# Patient Record
Sex: Female | Born: 1945 | Race: Black or African American | Hispanic: No | Marital: Single | Smoking: Current every day smoker
Health system: Southern US, Community
[De-identification: ages and names within clinical notes are randomized; demographics above are authoritative.]

## PROBLEM LIST (undated history)

## (undated) DIAGNOSIS — M1990 Unspecified osteoarthritis, unspecified site: Secondary | ICD-10-CM

## (undated) HISTORY — PX: APPENDECTOMY: SHX54

## (undated) HISTORY — DX: Unspecified osteoarthritis, unspecified site: M19.90

## (undated) HISTORY — PX: ABDOMINAL HYSTERECTOMY: SHX81

---

## 1997-08-24 ENCOUNTER — Emergency Department (HOSPITAL_COMMUNITY): Admission: EM | Admit: 1997-08-24 | Discharge: 1997-08-24 | Payer: Self-pay

## 1997-12-03 ENCOUNTER — Encounter: Payer: Self-pay | Admitting: Emergency Medicine

## 1997-12-03 ENCOUNTER — Emergency Department (HOSPITAL_COMMUNITY): Admission: EM | Admit: 1997-12-03 | Discharge: 1997-12-03 | Payer: Self-pay | Admitting: Emergency Medicine

## 1998-02-24 ENCOUNTER — Emergency Department (HOSPITAL_COMMUNITY): Admission: EM | Admit: 1998-02-24 | Discharge: 1998-02-24 | Payer: Self-pay | Admitting: Emergency Medicine

## 1999-02-10 ENCOUNTER — Emergency Department (HOSPITAL_COMMUNITY): Admission: EM | Admit: 1999-02-10 | Discharge: 1999-02-11 | Payer: Self-pay | Admitting: Emergency Medicine

## 1999-07-02 ENCOUNTER — Encounter: Admission: RE | Admit: 1999-07-02 | Discharge: 1999-07-02 | Payer: Self-pay | Admitting: *Deleted

## 1999-07-28 ENCOUNTER — Emergency Department (HOSPITAL_COMMUNITY): Admission: EM | Admit: 1999-07-28 | Discharge: 1999-07-28 | Payer: Self-pay

## 2000-06-07 ENCOUNTER — Emergency Department (HOSPITAL_COMMUNITY): Admission: EM | Admit: 2000-06-07 | Discharge: 2000-06-08 | Payer: Self-pay | Admitting: Emergency Medicine

## 2001-01-03 ENCOUNTER — Emergency Department (HOSPITAL_COMMUNITY): Admission: EM | Admit: 2001-01-03 | Discharge: 2001-01-03 | Payer: Self-pay | Admitting: Emergency Medicine

## 2001-01-03 ENCOUNTER — Encounter: Payer: Self-pay | Admitting: Emergency Medicine

## 2001-11-22 ENCOUNTER — Encounter: Admission: RE | Admit: 2001-11-22 | Discharge: 2001-11-22 | Payer: Self-pay | Admitting: Obstetrics and Gynecology

## 2001-11-22 ENCOUNTER — Encounter: Payer: Self-pay | Admitting: Obstetrics and Gynecology

## 2002-03-26 ENCOUNTER — Encounter: Payer: Self-pay | Admitting: Orthopedic Surgery

## 2002-03-26 ENCOUNTER — Encounter: Admission: RE | Admit: 2002-03-26 | Discharge: 2002-03-26 | Payer: Self-pay | Admitting: Orthopedic Surgery

## 2004-01-08 ENCOUNTER — Encounter: Admission: RE | Admit: 2004-01-08 | Discharge: 2004-01-08 | Payer: Self-pay | Admitting: Family Medicine

## 2005-09-30 ENCOUNTER — Ambulatory Visit: Payer: Self-pay | Admitting: Internal Medicine

## 2005-10-01 ENCOUNTER — Ambulatory Visit: Payer: Self-pay | Admitting: *Deleted

## 2005-11-25 ENCOUNTER — Ambulatory Visit: Payer: Self-pay | Admitting: Internal Medicine

## 2005-11-30 ENCOUNTER — Encounter: Admission: RE | Admit: 2005-11-30 | Discharge: 2005-11-30 | Payer: Self-pay | Admitting: Internal Medicine

## 2005-12-08 ENCOUNTER — Ambulatory Visit: Payer: Self-pay | Admitting: Family Medicine

## 2005-12-09 ENCOUNTER — Ambulatory Visit (HOSPITAL_COMMUNITY): Admission: RE | Admit: 2005-12-09 | Discharge: 2005-12-09 | Payer: Self-pay | Admitting: Internal Medicine

## 2005-12-16 ENCOUNTER — Ambulatory Visit: Payer: Self-pay | Admitting: Internal Medicine

## 2006-03-22 ENCOUNTER — Ambulatory Visit: Payer: Self-pay | Admitting: Internal Medicine

## 2006-11-02 ENCOUNTER — Encounter (INDEPENDENT_AMBULATORY_CARE_PROVIDER_SITE_OTHER): Payer: Self-pay | Admitting: *Deleted

## 2007-01-11 ENCOUNTER — Ambulatory Visit: Payer: Self-pay | Admitting: Internal Medicine

## 2007-04-13 ENCOUNTER — Ambulatory Visit: Payer: Self-pay | Admitting: Family Medicine

## 2007-06-30 ENCOUNTER — Encounter (INDEPENDENT_AMBULATORY_CARE_PROVIDER_SITE_OTHER): Payer: Self-pay | Admitting: Surgery

## 2007-06-30 ENCOUNTER — Inpatient Hospital Stay (HOSPITAL_COMMUNITY): Admission: EM | Admit: 2007-06-30 | Discharge: 2007-07-13 | Payer: Self-pay | Admitting: Emergency Medicine

## 2007-07-20 ENCOUNTER — Encounter: Admission: RE | Admit: 2007-07-20 | Discharge: 2007-07-20 | Payer: Self-pay | Admitting: Surgery

## 2008-01-26 ENCOUNTER — Ambulatory Visit: Payer: Self-pay | Admitting: Internal Medicine

## 2008-02-02 ENCOUNTER — Ambulatory Visit: Payer: Self-pay | Admitting: Family Medicine

## 2008-02-22 ENCOUNTER — Ambulatory Visit: Payer: Self-pay | Admitting: Internal Medicine

## 2008-02-23 ENCOUNTER — Encounter: Payer: Self-pay | Admitting: Family Medicine

## 2008-02-23 LAB — CONVERTED CEMR LAB
ALT: 22 units/L (ref 0–35)
AST: 18 units/L (ref 0–37)
Albumin: 4.3 g/dL (ref 3.5–5.2)
Alkaline Phosphatase: 107 units/L (ref 39–117)
BUN: 6 mg/dL (ref 6–23)
Basophils Absolute: 0 10*3/uL (ref 0.0–0.1)
Basophils Relative: 0 % (ref 0–1)
CO2: 18 meq/L — ABNORMAL LOW (ref 19–32)
Calcium: 9 mg/dL (ref 8.4–10.5)
Chloride: 108 meq/L (ref 96–112)
Cholesterol: 106 mg/dL (ref 0–200)
Creatinine, Ser: 0.69 mg/dL (ref 0.40–1.20)
Eosinophils Absolute: 0.2 10*3/uL (ref 0.0–0.7)
Eosinophils Relative: 1 % (ref 0–5)
Glucose, Bld: 104 mg/dL — ABNORMAL HIGH (ref 70–99)
HCT: 42.7 % (ref 36.0–46.0)
HDL: 56 mg/dL (ref >39)
Hemoglobin: 13.7 g/dL (ref 12.0–15.0)
LDL Cholesterol: 35 mg/dL (ref 0–99)
Lymphocytes Relative: 42 % (ref 12–46)
Lymphs Abs: 4.5 10*3/uL — ABNORMAL HIGH (ref 0.7–4.0)
MCHC: 32.1 g/dL (ref 30.0–36.0)
MCV: 90.3 fL (ref 78.0–100.0)
Monocytes Absolute: 0.7 10*3/uL (ref 0.1–1.0)
Monocytes Relative: 6 % (ref 3–12)
Neutro Abs: 5.5 10*3/uL (ref 1.7–7.7)
Neutrophils Relative %: 51 % (ref 43–77)
Platelets: 236 10*3/uL (ref 150–400)
Potassium: 3.5 meq/L (ref 3.5–5.3)
RBC: 4.73 M/uL (ref 3.87–5.11)
RDW: 15.9 % — ABNORMAL HIGH (ref 11.5–15.5)
Sodium: 145 meq/L (ref 135–145)
TSH: 1.391 microintl units/mL (ref 0.350–4.50)
Total Bilirubin: 0.4 mg/dL (ref 0.3–1.2)
Total CHOL/HDL Ratio: 1.9
Total Protein: 7.5 g/dL (ref 6.0–8.3)
Triglycerides: 73 mg/dL (ref <150)
VLDL: 15 mg/dL (ref 0–40)
WBC: 10.8 10*3/uL — ABNORMAL HIGH (ref 4.0–10.5)

## 2008-03-15 ENCOUNTER — Ambulatory Visit: Payer: Self-pay | Admitting: Internal Medicine

## 2008-06-28 ENCOUNTER — Ambulatory Visit: Payer: Self-pay | Admitting: *Deleted

## 2008-07-02 ENCOUNTER — Ambulatory Visit: Payer: Self-pay | Admitting: *Deleted

## 2008-09-09 ENCOUNTER — Ambulatory Visit: Payer: Self-pay | Admitting: Internal Medicine

## 2008-11-25 ENCOUNTER — Ambulatory Visit: Payer: Self-pay | Admitting: Family Medicine

## 2008-12-25 ENCOUNTER — Ambulatory Visit: Payer: Self-pay | Admitting: Family Medicine

## 2010-01-10 IMAGING — CT CT ABDOMEN W/ CM
2 of 5 series · 17 of 46 positions shown, 19 images · IV contrast ([ID]/WATER & 100 ML OMNI 300)
Comparison: 06/27/2007.

CT ABDOMEN

CLINICAL DATA: Leukocytosis.  Status post laparoscopic
appendectomy for ruptured appendix.  Clinical concern for abscess.
Previous partial hysterectomy.

CT ABDOMEN AND PELVIS WITH CONTRAST
TECHNIQUE: Multidetector CT imaging of the abdomen and pelvis was
performed using the standard protocol following bolus
administration of intravenous contrast.
Contrast: 100 ml Pmnipaque-SYY

[Series 2: routine abdomen · axial · 0.70mm/px · z∈[-490,-60]mm · 14 of 96 slices shown, 16 images]
[im 5/96  soft-tissue]
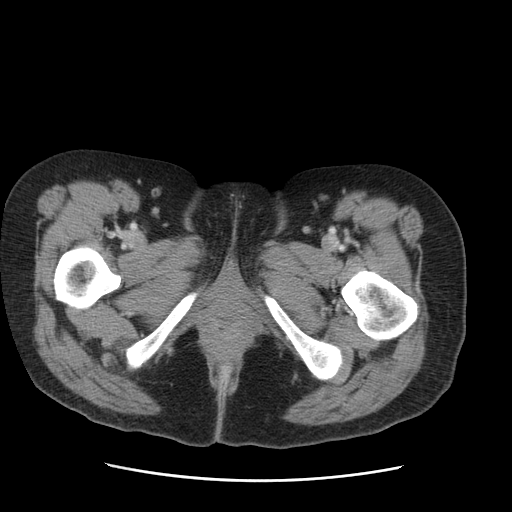
[im 5/96  bone]
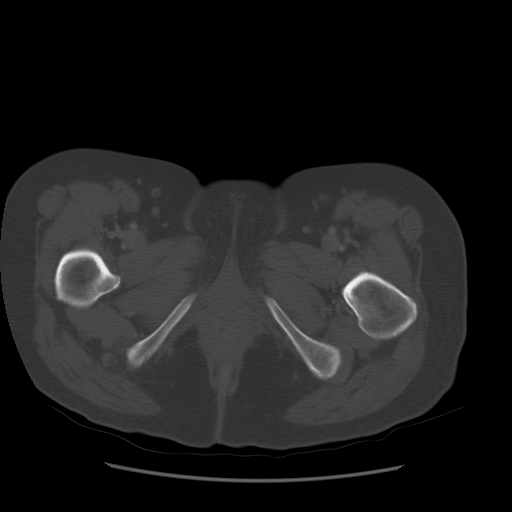
[im 14/96  soft-tissue]
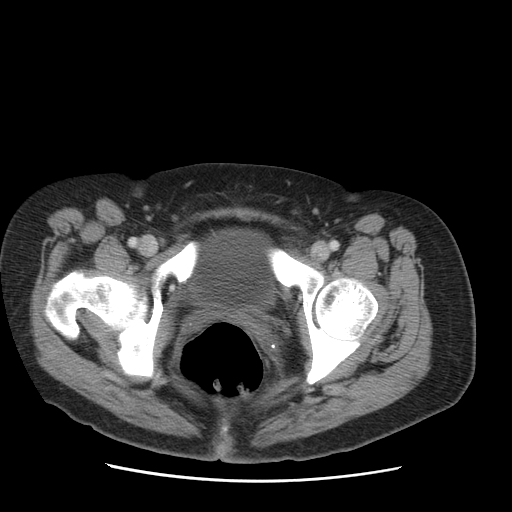
[im 19/96  soft-tissue]
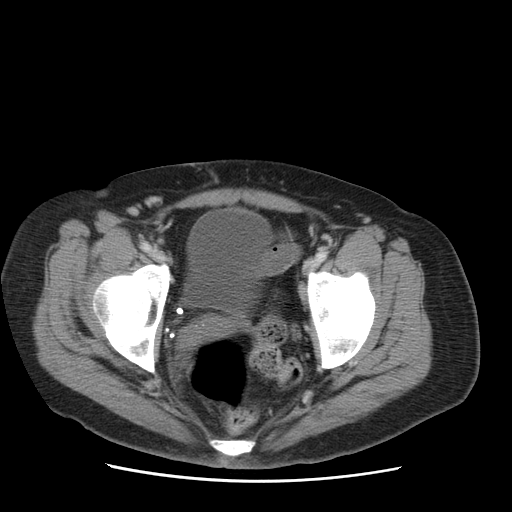
[im 28/96  soft-tissue]
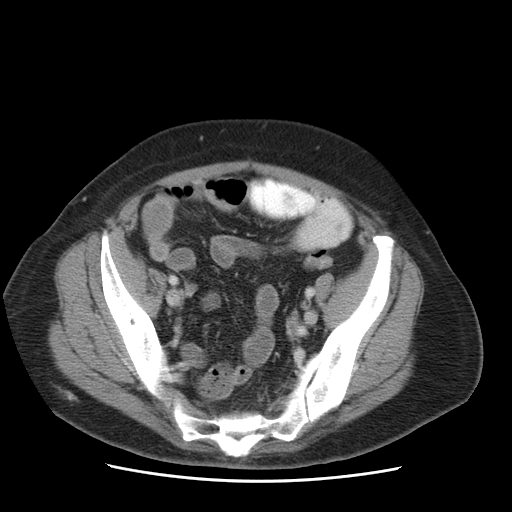
[im 32/96  soft-tissue]
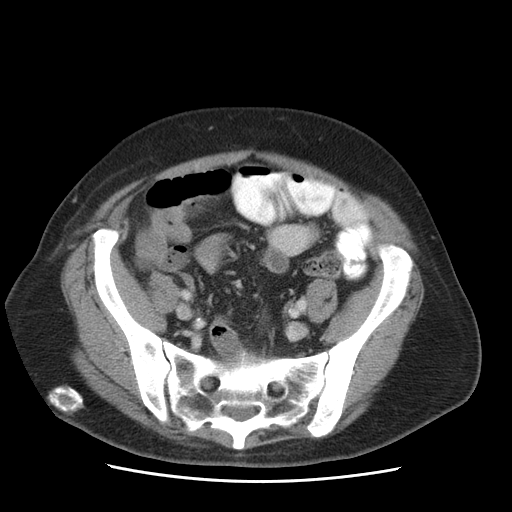
[im 37/96  soft-tissue]
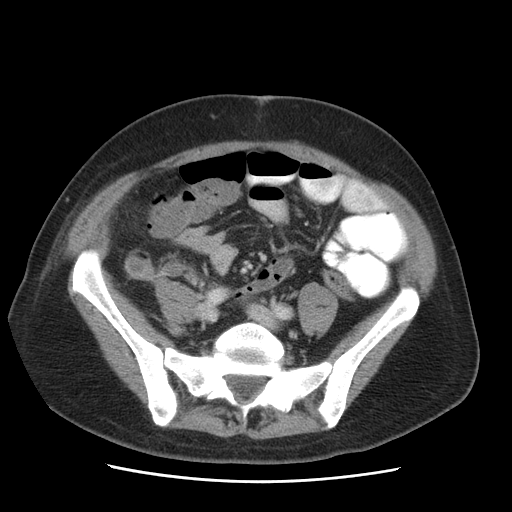
[im 46/96  soft-tissue]
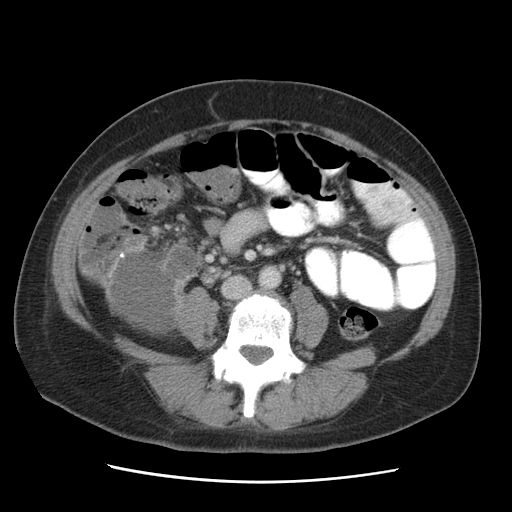
[im 50/96  soft-tissue]
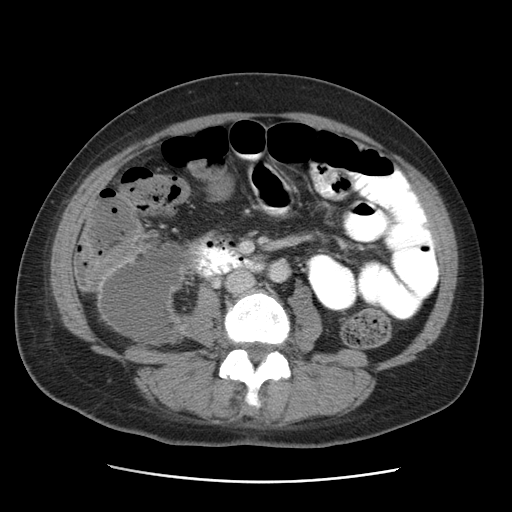
[im 59/96  soft-tissue]
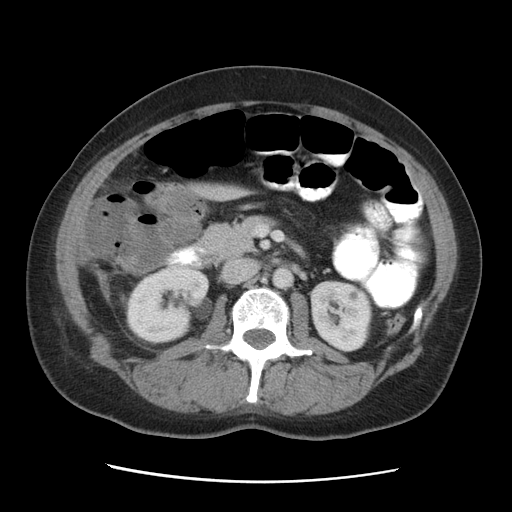
[im 59/96  bone]
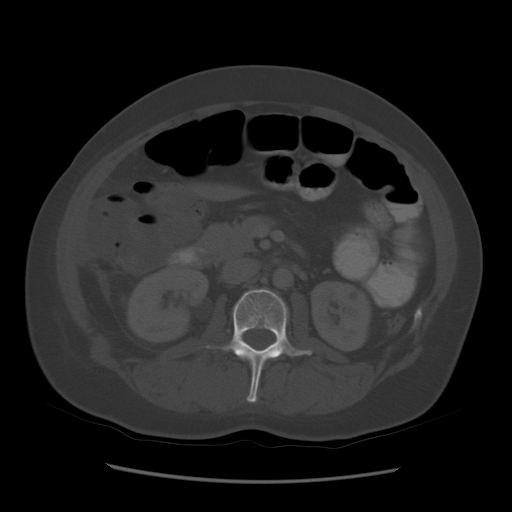
[im 64/96  soft-tissue]
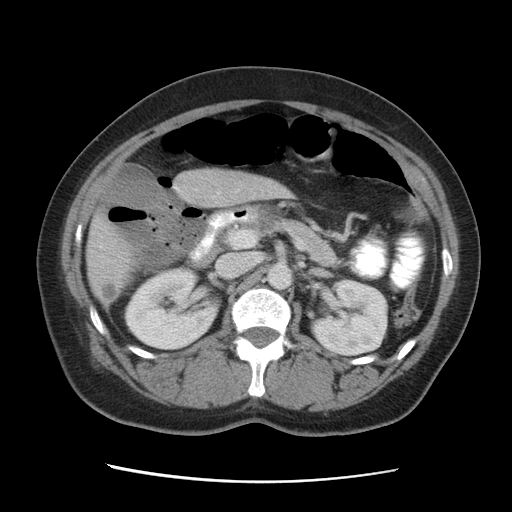
[im 73/96  soft-tissue]
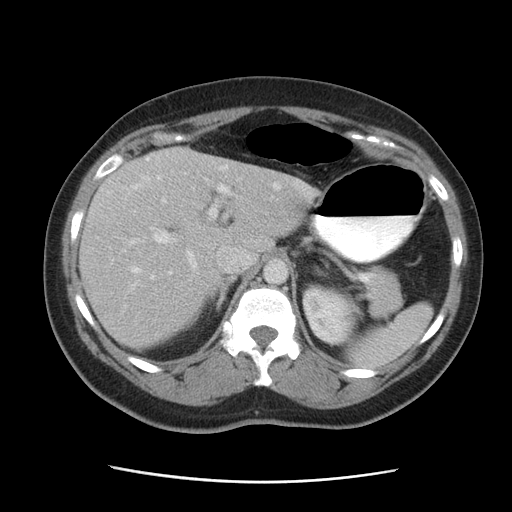
[im 77/96  soft-tissue]
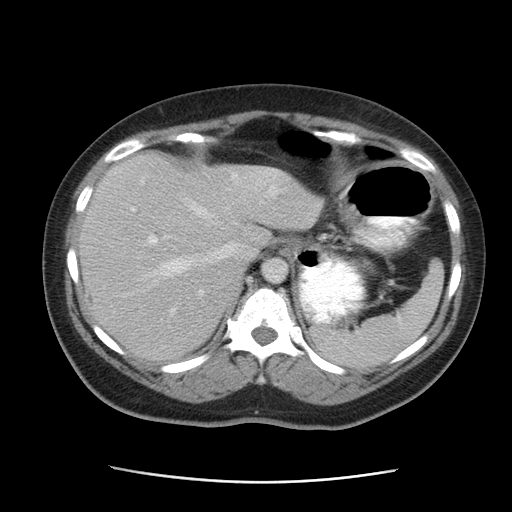
[im 82/96  soft-tissue]
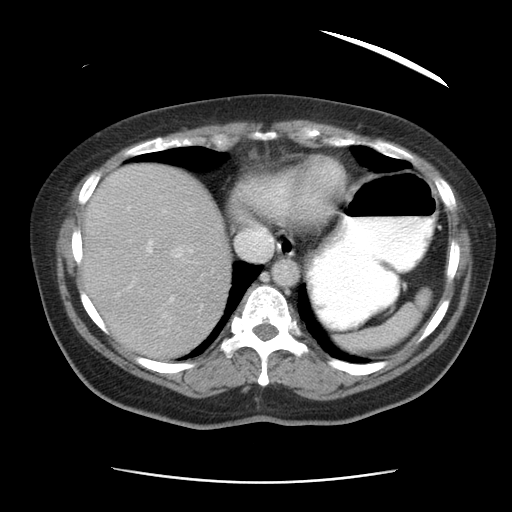
[im 91/96  soft-tissue]
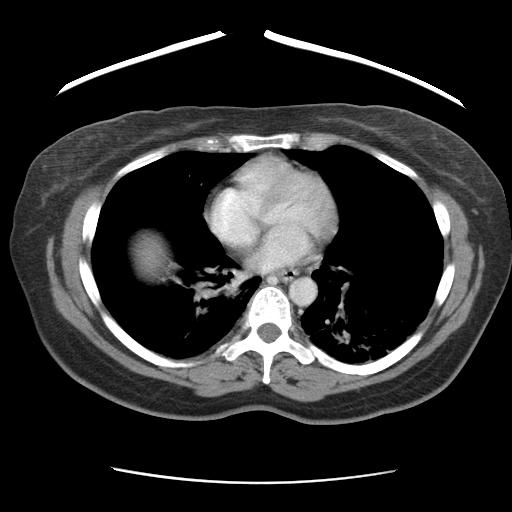

[Series 400: cor abd/pel · coronal · 0.94mm/px · 3 of 58 slices shown]
[im 20/58  soft-tissue]
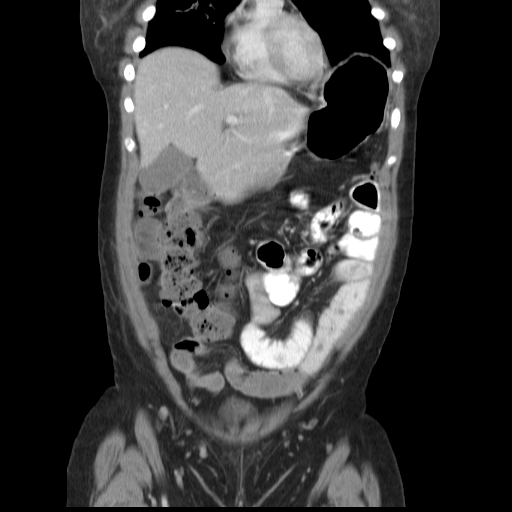
[im 26/58  soft-tissue]
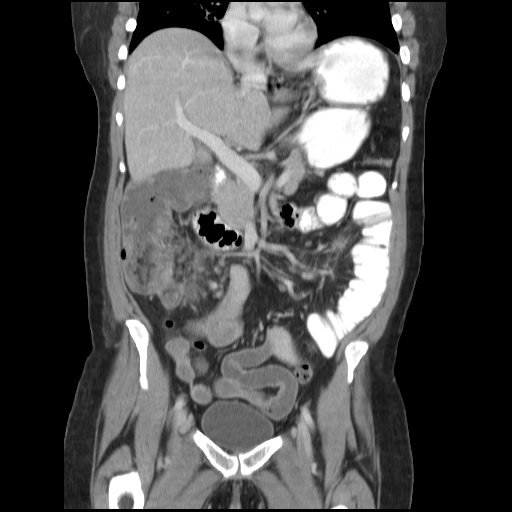
[im 32/58  soft-tissue]
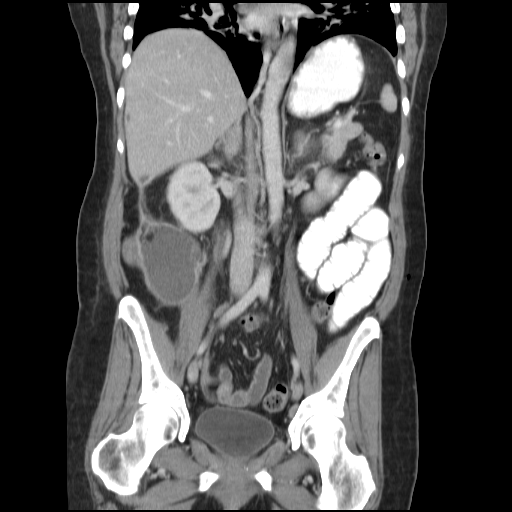

[17 of 46 positions shown; findings below may reference images not displayed]

FINDINGS: Bibasilar atelectasis.  Stable mild diffuse fatty
infiltration.  Interval small amount of subcapsular hepatic fluid.
Interval rim enhancing fluid collection in the right lower abdomen,
measuring 7.4 x 6.5 x 5.3 cm in maximum dimensions.  Mildly dilated
proximal small bowel loops.  No free peritoneal air.
IMPRESSION: 1.  7.4 x 6.5 x 5.3 cm right lower quadrant abscess.
2.  Bibasilar atelectasis.
3.  Interval small amount of subcapsular hepatic fluid.
4.  Mild small bowel ileus.

CT PELVIS
FINDINGS: Normal appearing pelvic bowel loops.  Surgically absent
uterus.  No free peritoneal fluid.  Right buttock injection
granuloma.
IMPRESSION: No acute pelvic abnormality.

## 2010-01-11 IMAGING — CT CT ABCESS DRAINAGE
1 series · 16 of 32 positions shown, 19 images · non-contrast
Comparison: none

EXAMINATION:

CT GUIDED RIGHT LOWER QUADRANT APPENDICEAL ABSCESS DRAINAGE
PROCEDURE.
CLINICAL DATA: Ruptured appendicitis with right lower quadrant
abscess

[Series 2: abd pelvis · axial · 0.74mm/px · z∈[-278,+154]mm · 16 of 145 slices shown, 19 images]
[im 10/145  soft-tissue]
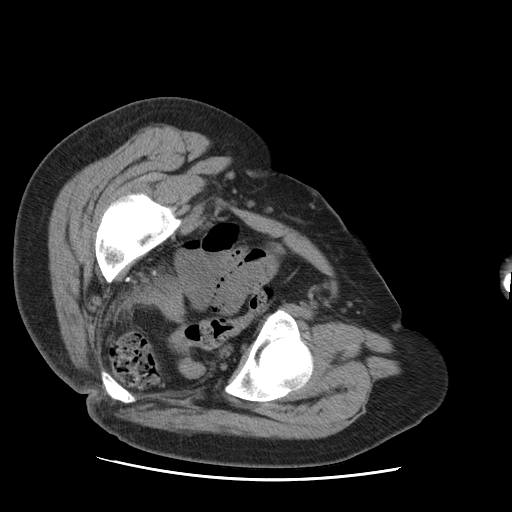
[im 10/145  bone]
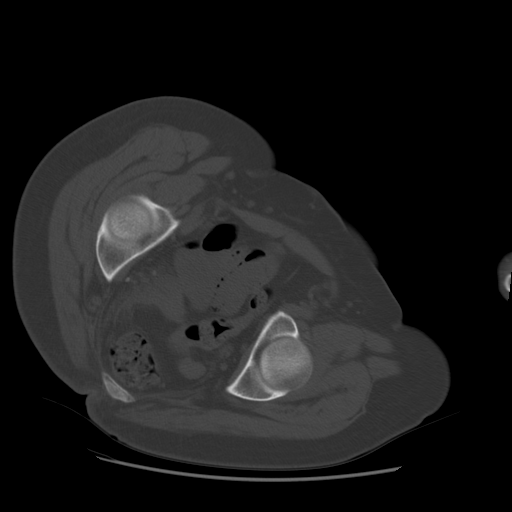
[im 19/145  soft-tissue]
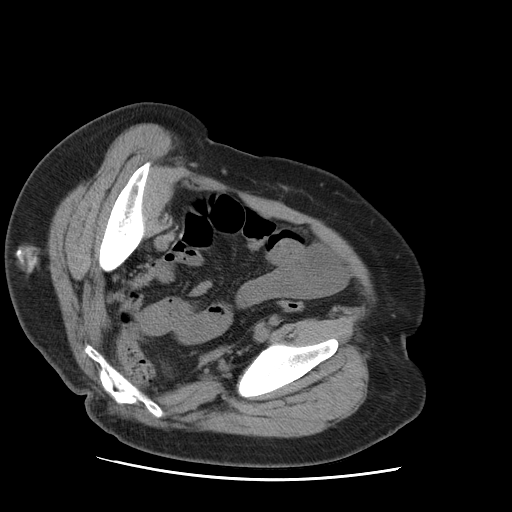
[im 28/145  soft-tissue]
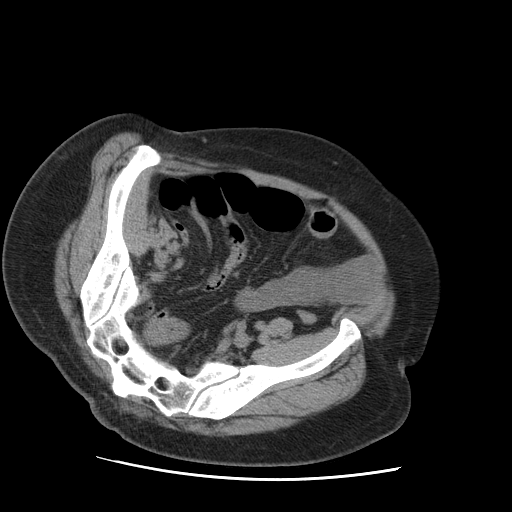
[im 42/145  soft-tissue]
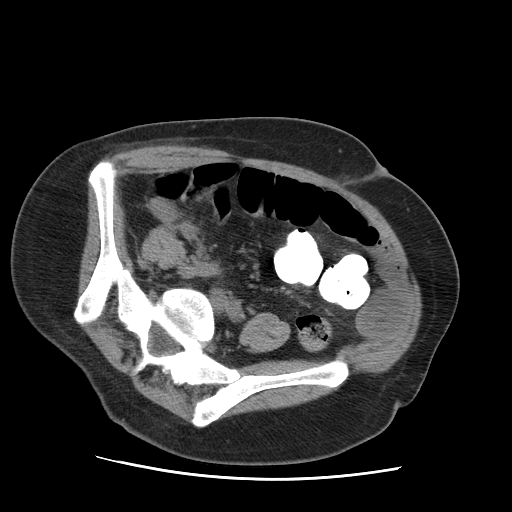
[im 52/145  soft-tissue]
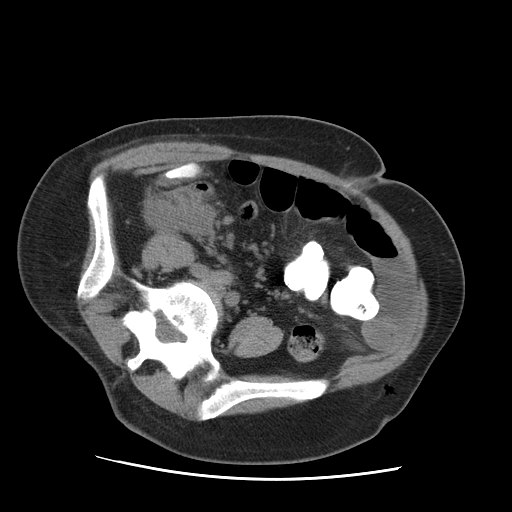
[im 61/145  soft-tissue]
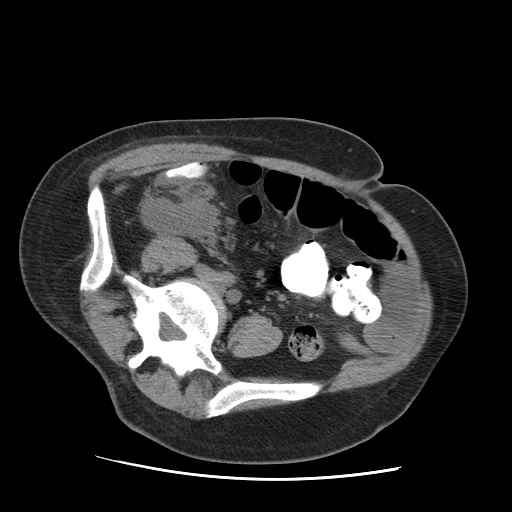
[im 75/145  soft-tissue]
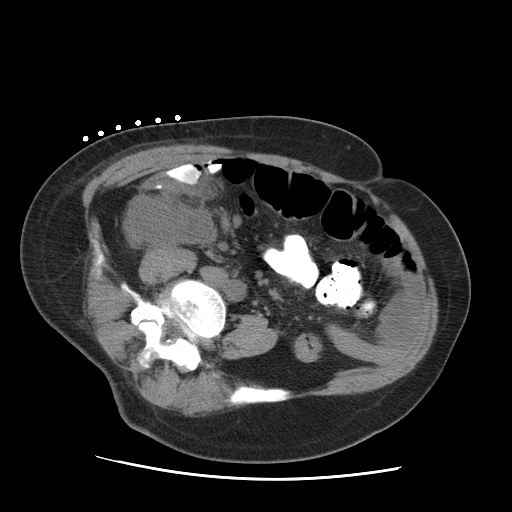
[im 84/145  soft-tissue]
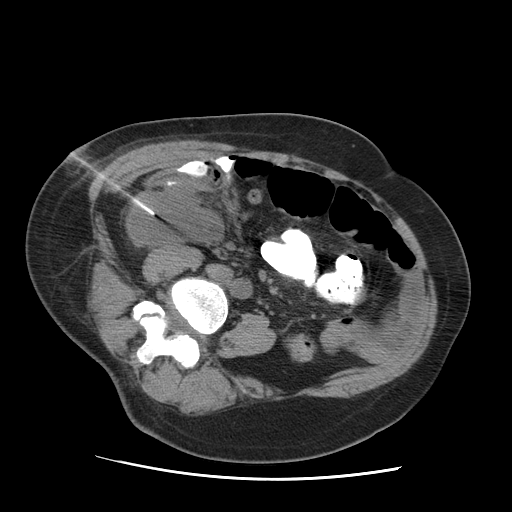
[im 93/145  soft-tissue]
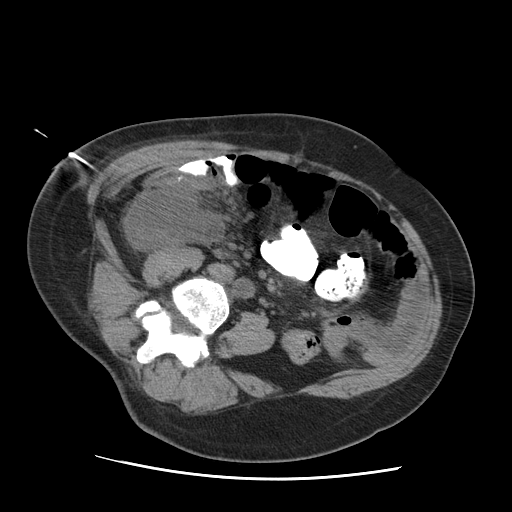
[im 93/145  bone]
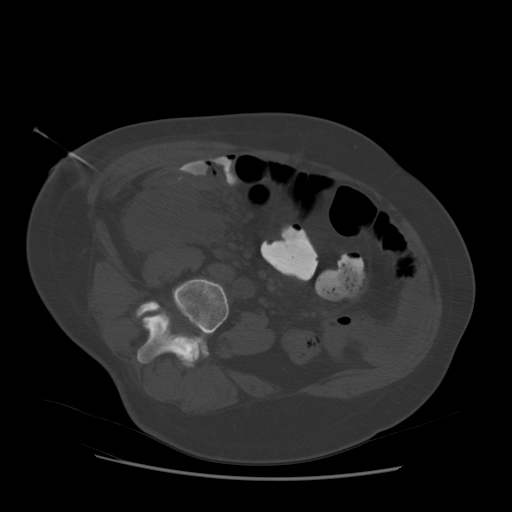
[im 103/145  soft-tissue]
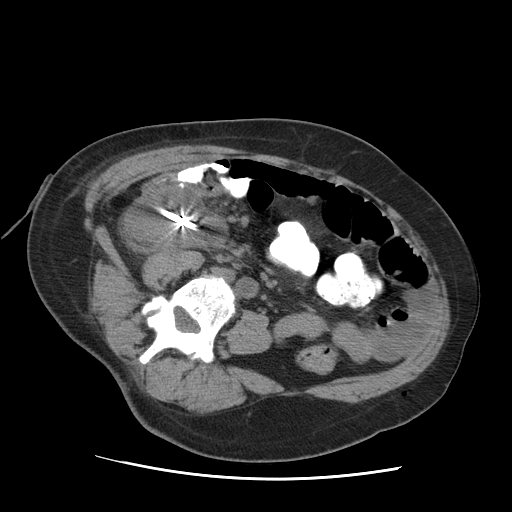
[im 117/145  soft-tissue]
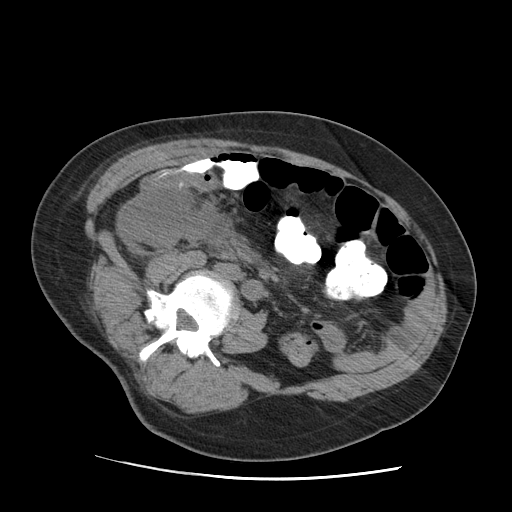
[im 121/145  lung]
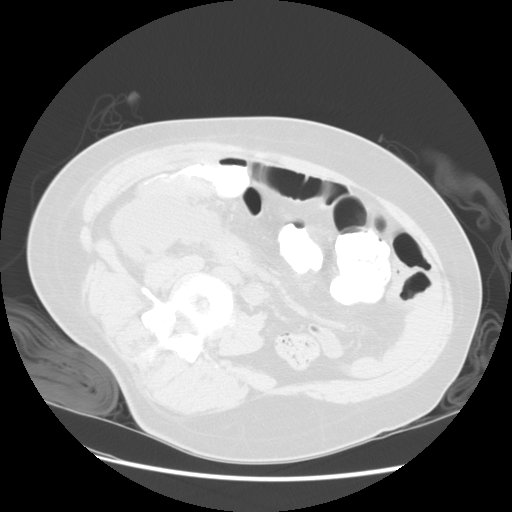
[im 126/145  soft-tissue]
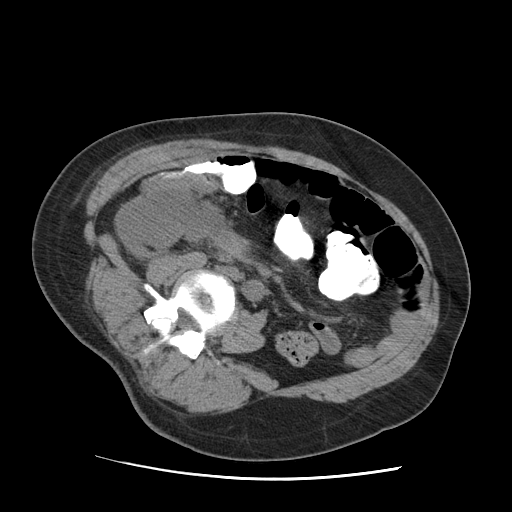
[im 131/145  lung]
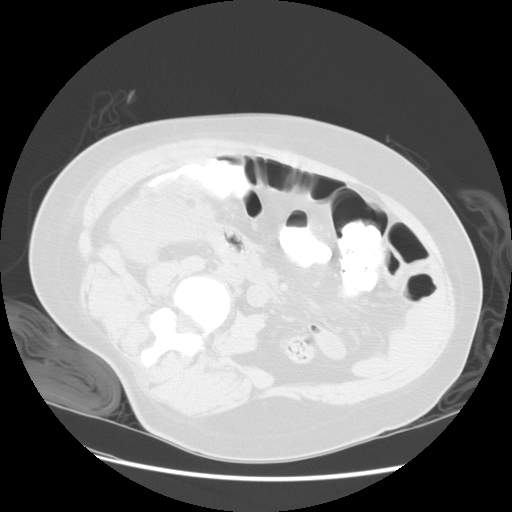
[im 135/145  soft-tissue]
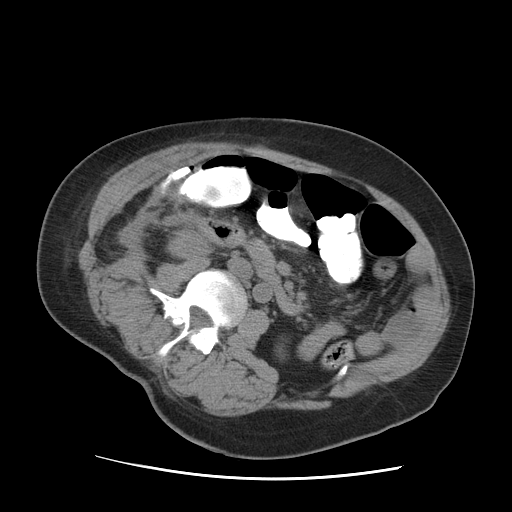
[im 135/145  lung]
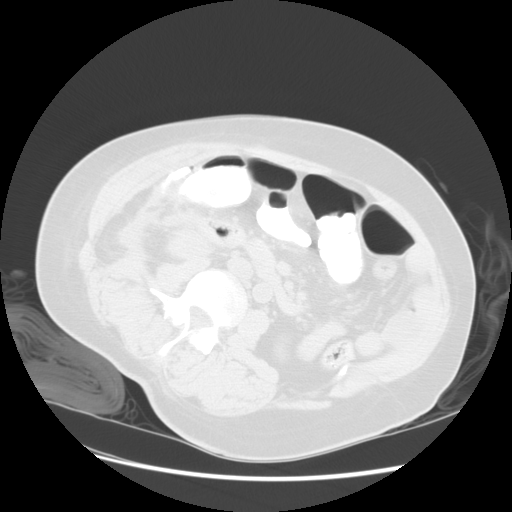
[im 140/145  lung]
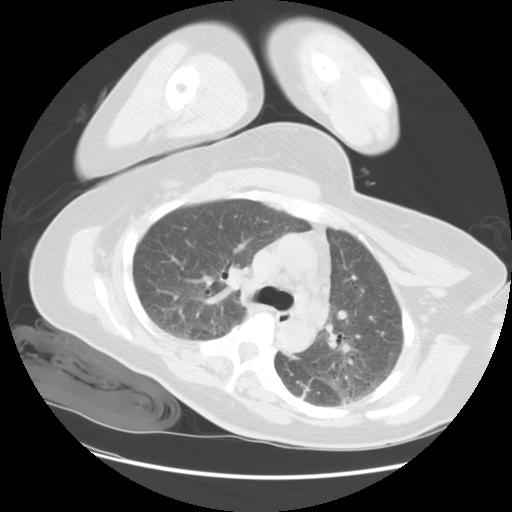

[16 of 32 positions shown; findings below may reference images not displayed]

Date:07/10/2007 [DATE]

 Radiologist:SENTENO.Moatshe, M.D.

Medications:2 mg Versed, 100 mcg Fentanyl

Guidance:CT

Fluoroscopy time:None.

Sedation time:30 minutes

Contrast volume:None.

Complications:No immediate

PROCEDURE/FINDINGS:

Informed consent was obtained from the patient following
explanation of the procedure, risks, benefits and alternatives.
The patient understands, agrees and consents for the procedure.
All questions were addressed.  A time out was performed.

The patient was positioned left posterior oblique.  Noncontrast
localization CT was performed through the lower abdomen and pelvis
redemonstrating the right lower quadrant appendiceal abscess.
Under sterile conditions and local anesthesia, an 18 gauge 15 cm
coaxial introducer needle was advanced from a lateral approach into
the fluid collection.  Needle position was confirmed with CT.
Syringe aspiration yielded purulent fluid.  An Amplatz guide wire
was inserted followed by dilatation to advance a 12-French drain.
Retention loop was formed in the abscess cavity and confirmed with
CT imaging.  Syringe aspiration yielded 95 ml of exudative fluid.
The catheter was secured with a Prolene suture and connected to
external drainage.  No immediate complications.  The patient
tolerated procedure well.
IMPRESSION: CT guided right lower quadrant appendiceal abscess
drainage procedure with insertion of a 12-French drain.  95 ml of
exudative fluid removed.  Samples sent for Gram stain and culture.

## 2010-06-30 NOTE — H&P (Signed)
NAMEADRIENA, [name]              ACCOUNT NO.:  [id number]   MEDICAL RECORD NO.:  [id number]          PATIENT TYPE:  INP   LOCATION:  5125                         FACILITY:  [facility]   PHYSICIAN:  Sandria Bales. Ezzard Standing, M.D.  DATE OF BIRTH:  1945-05-12   DATE OF ADMISSION:  06/30/2007  DATE OF DISCHARGE:                              HISTORY & PHYSICAL   Date of admission ??   HISTORY OF PRESENT ILLNESS:  This is a 65 year old black female patient  of [facility] and Dr. Donia Guiles who has developed abdominal pain  beginning yesterday morning.  She tried some warm soaks or warm baths at  home which has helped belly pain she has had before, but this is not yet  better.  This morning, she tried to call [facility], but they could not  see her for about 2 months.  She was referred to the [facility] Emergency [place] was [place] there, she said about 10 through 11 this  morning.  They apparently ordered a CT scan.  CT scan is consistent with  appendicitis and I was called, though right now it is about 10 o'clock  at night.   She has no prior history of peptic ulcer disease, liver disease,  pancreatic disease, or colon disease.  Only prior abdominal surgery with  a hysterectomy about 20 years ago for benign causes.  She has had no  other abdominal symptoms.  She has had some nausea but no vomiting.  She  has had no change in her bowel habits.   PAST MEDICAL HISTORY:  She states she has allergy to PENICILLIN, but  this happened some 40 years ago, when she says she passed out.   CURRENT MEDICATIONS:  1. Protonix for reflux.  2. Allegra, she takes for allergies p.r.n.  3. Takes an antiinflammatory drug that she does not know the name of      it.   REVIEW OF SYSTEMS:  NEUROLOGIC: No history of seizure or loss of  consciousness.  PULMONARY:  She smokes half a pack of cigarettes a day.  Knows this is  bad for health, but she is retired from [employer] and she continues to  smoke  cigarettes.  She did have pneumonia and was hospitalized, but this  was about 30 years ago.  CARDIAC:  She has had no heart disease, chest pain, hypertension, or  cardiac catheterization.  GASTROINTESTINAL:  See history of present illness.  UROLOGIC:  No kidney stones or kidney infection.   PERSONAL HISTORY:  Again, she has worked at [employer]  she retired 2 years ago in 2007.  She lives by herself.  She has 2  daughters, a daughter who lives in [place] and then a daughter who  lives in town.  I do not think her children are aware she is at the  hospital.   PHYSICAL EXAMINATION:  VITAL SIGNS:  Her temperature is 97.7, blood  pressure 122/69, pulse 81, and respirations 16.  HEENT:  Unremarkable.  NECK:  Supple without masses or thyromegaly.  LYMPH NODES:  She has no supraclavicular or inguinal  adenopathy.  LUNGS:  Symmetric to auscultation, though she has kind of poor  inspiratory effort, I think because of her abdominal pain.  HEART:  Has a regular rate and rhythm.  ABDOMEN:  She actually has some bowel sounds, but she is exquisitely  tender with guarding on the right side of her abdomen and she is mildly  distended.  She has no abdominal mass that I can feel.  She does have  some rebound.  EXTREMITIES:  She has grip strength in the upper and lower extremities.  NEUROLOGIC:  Grossly intact to motor and sensory function.   LABORATORY DATA:  Her labs that I have show a white blood count of  19,100 with 77% neutrophils.  Her hemoglobin is 13, hematocrit 39.8.  Sodium is 142, potassium 3.3, chloride of 105, BUN of 6, creatinine of  0.8.  Her liver functions are normal with a total protein of 6.8 and  urinalysis was negative.   CT was reviewed with Dr. Irish Lack, and she does have apparent  appendicitis with possible rupture of the appendix locally.  She has a  very large appendicolith with a dilated appendix.  She also has some  bilateral atelectasis consistent  with probably her intra-abdominal  symptoms and possibly her chronic smoking.   IMPRESSION:  1. Appendicitis.  Discussed with the patient about proceeding with      appendectomy tonight.  I have told her that there is about an 80%      chance I can do this laparoscopically, there may be as much as a      20% chance she will need to have an open surgery.  I think it      depends on how bad the appendix is as to her length of      hospitalization, but I told her anywhere from 1 to 5 days in the      hospital.  Risk of surgery include infections which I think she      already has, bleeding, the possibility of open surgery, and      possibility of bowel resection.  2. History of cigarette smoking.  She knows this is bad for health.  3. Gastroesophageal reflux disease.  4. Allergies.  5. Arthritis.  Her problems are back.  She has seen a specialist she      said, but she could not remember his name nor what office he is in.  6. Bilateral atelectasis by CT.      Sandria Bales. Ezzard Standing, M.D.  Electronically Signed     DHN/MEDQ  D:  06/30/2007  T:  07/01/2007  Job:  [id number]   cc:   Dineen Kid. Reche Dixon, M.D.

## 2010-06-30 NOTE — Discharge Summary (Signed)
[name], [name]              ACCOUNT NO.:  [id number]   MEDICAL RECORD NO.:  [id number]          PATIENT TYPE:  INP   LOCATION:  5125                         FACILITY:  [facility]   PHYSICIAN:  Maisie Fus A. Cornett, M.D.DATE OF BIRTH:  10/10/1945   DATE OF ADMISSION:  06/30/2007  DATE OF DISCHARGE:  07/13/2007                               DISCHARGE SUMMARY   ADMITTING PHYSICIAN:  Sandria Bales. Ezzard Standing, M.D.   DISCHARGING PHYSICIAN:  Clovis Pu. Cornett, M.D.   CHIEF COMPLAINT AND REASON FOR ADMISSION:  [name] is a 65 year old  female patient who receives primary care at [facility] who developed  abdominal pain 24 hours prior to admission.  She presented to [facility] ER  where on clinical exam, she had an acute abdomen.  Her white count was  19,100 with neutrophils 77%.  A CT was performed and was also reviewed  by Dr. Ezzard Standing with Dr. Fredia Sorrow, which showed she had apparent  appendicitis with possible rupture of the appendix locally.  A very  large appendicolith with a dilated appendix was also seen.  She also was  noted to have bilateral atelectasis consistent with probable early  pneumonia exacerbated by splinting from intra-abdominal symptoms, and  she has a history of tobacco abuse, which probably is contributing to  this as well.  [name] was admitted with following diagnoses:  1. Acute appendicitis.  2. Tobacco abuse with bibasilar atelectasis on initial CT.  3. Gastroesophageal reflux disease.  4. Seasonal allergies.  5. Osteoarthritis of the back.   HOSPITAL COURSE:  The patient was admitted from the ER.  She was started  empirically on Invanz IV and subsequently taken to the OR by Dr. Ezzard Standing  where she underwent a laparoscopic appendectomy for acute perforated  appendicitis.  Please refer to his operative note for details.  The  patient tolerated the procedure well and was sent to PACU to recover.  The patient was subsequently sent to the general floor.  After  recovering in  the PACU, she was found to have within the first 24 hours  a T-max of 102.9, heart rate in the 120s.  She was desating down to 79%  on room air, up to 89% on 4 liters.  She was subsequently transferred to  the stepdown unit for additional monitoring.   On postop day #2, the patient continued with respiratory difficulty.  A  chest x-ray was checked after the patient was found to have bilateral  rales without wheezing.  She was later placed on 100% non-rebreather and  transferred to ICU setting.  Her sats were 92% on 100% non-rebreather.  She was still short of breath, respiratory rate was 28, heart rate in  the 120s, and temperature was 100.7.  Subsequent chest x-ray showed  combination of pneumonia and volume overload.  She was given several  doses of Lasix, which did help improve some of her symptoms.  She also  had significant abdominal splinting, so pain management was addressed,  and she was instructed on appropriate use of the incentive spirometry.  Her initial levels on the incentive spirometry were 500-700.  This did  improve over time.   In regards to her abdominal procedure, her abdomen was soft, dressings  were clean, dry, and intact.  She had bowel sounds and positive BM and  flatus.  By postop day #3, her white count peaked out at 29,400.  At  that time, she was tolerating a solid diet, so her IV fluids were  decreased to [place].  This was also done because of the volume overload  issues.  Because of the persistent hypoxia, CT of the chest was ordered  to rule out pulmonary embolus.  Postoperatively, it was felt it was safe  enough to add Lovenox to her DVT prophylaxis regimen and because of  chest x-ray showing bilateral pneumonia, her Pincus Sanes was changed to  Primaxin by Dr. Daphine Deutscher.   Over the next several days, the patient's white count trended down.  Her  pathology showed suppurative appendicitis without malignancy.  She  continued to have mildly abnormal lung sounds on  auscultation.  Her  abdomen became more distended despite having bowel sounds, and she  eventually developed problems related to postoperative ileus, therefore  her diet was backed off.  X-ray confirmed multiple loops of dilated  small bowel, but air in the colon consistent with ileus versus early  partial small-bowel obstruction.  She continued to require O2 for her  pulmonary symptoms.   By postop day #6, the patient had a very large bowel movement and  continued to have bowel movements.  Her abdomen was soft, nondistended,  and her diet was advanced.  She had bowel sounds.  Her white count  though began to creep up at 17,300, and since she had persistent  pulmonary symptoms, it was felt this may be the etiology and we  continued to observe the patient.  By postop day #7, the patient  continued with bilateral crackles, left greater than right.  She was  decreasing her sats down to 80% with heart rate increasing in the 150s  to 120s on room air.  She was given an additional dose of Lasix, but  from a surgical standpoint, she was tolerating a regular diet.   By postop day #7, her abdomen was benign.  She was still having similar  issues regarding pulmonary issues, but they seemed to be better.  She  was desating more at night and less during the day, mainly using oxygen  at night.  Unfortunately, her white count had increased further to  18,900.  By postop day #8, her white count had increased further to  20,200.  Her abdomen still was benign and nontender.  Dr. Johna Sheriff who  was covering for doctor of the week service was concerned that she may  be developing a postoperative abscess and since she was at least 7 days  out, a CT of the abdomen and pelvis was ordered.  This did demonstrate a  right lower quadrant abscess.  Interventional radiology was consulted  for evaluation for placement of percutaneous drain.   On Jul 10, 2007, the patient was taken to the radiology department  where  she underwent a percutaneous drain placement into the right lower  quadrant abscess.  Moderate amount of tan and yellow creamy purulent  material was obtained and sent for cultures.   For the remainder of the hospitalization, the patient continued to  improve, but from a pulmonary status and from an abdominal status, she  did have a brief peak in her white count to 21,800, which prompted a  change in antibiotics from Avelox to Flagyl, which she had been placed  on in preparation for going home with combination of pneumonia and intra-  abdominal symptoms to back on Primaxin to cover pneumonia and atypical  enteric pathogens.  By postop day 12, her white count had decreased to  14,800.  She was completely off oxygen during the daytime hours  maintaining saturations between 91% and 92%, only requiring oxygen at  night.  Her abdomen was benign.  Her incisions were clean, dry, and  intact.  Her right lower quadrant drain was draining about 50-60 mL of  purulent material in a 24-hour period.  She had seven stools on postop  day 12 and by postop day 13, on the day of discharge, the patient had  decreased to one stool in a 24-hour period.  She was completely off  oxygen and not requiring any at night as well.  Her white count had  normalized to 10,300.  Her abdomen was completely benign.  She was  having about 45 mL in a 24-hour period of creamy, yellow, thin drainage  from the right lower quadrant drain.  Her abdomen again was benign.  Her  abscess cultures demonstrated E. coli resistant to ampicillin sensitive  to Cipro and Proteus mirabilis sensitive to Cipro.  The patient has a  PENICILLIN allergy so, we opted to treat with the Cipro.  Also, Cipro  has a lower MIC of 0.25.  The patient was afebrile and otherwise deemed  appropriate for discharge home.   DISCHARGE DIAGNOSES:  1. Acute appendicitis with perforation.  2. Status post laparoscopic appendectomy on Jun 30, 2007 per Dr.       Ezzard Standing.  3. History of tobacco abuse preoperatively.  4. Bibasilar atelectasis with bibasilar pneumonia and mild respiratory      failure this admission.  5. Mild respiratory failure secondary to volume overload, resolved.  6. Postoperative right lower quadrant abscess status post percutaneous      drainage by interventional radiology on Jul 10, 2007. Cultures      subsequently positive for E. coli and Proteus as noted.   DISCHARGE MEDICATIONS:  The patient will resume the following home  medications:  1. Amitriptyline 25 mg p.r.n.  2. Protonix 40 mg daily.  3. Allegra 180 mg daily.  4. Nabumetone 500 mg p.r.n.   NEW MEDICATIONS:  Include:  1. Vicodin 5/325 1-2 tablets every 4 hours as needed for pain.  2. Cipro 500 mg b.i.d. for the next 10 days.  3. Ibuprofen 600 mg t.i.d. p.r.n. for pain.  May take in addition to      Vicodin.   OTHER INSTRUCTIONS:  1. Return to work in 3 weeks or as directed by Dr. Ezzard Standing.  Note has      been given.  Diet, no restrictions.  Wound care, allowing the Steri-      Strips to fall off.  2. Measure and record drainage from right lower quadrant drain bulb.      Bring this information to MD visit.  3. Flush right lower quadrant drain with 10 mL of normal saline every      8-12 hours.  Home health RN to assist.  The patient should be able      to learn of this activity and perform herself at home after      discharge.   ACTIVITY:  Increase activity slowly, may walk up steps.  Sponge bathe  only while drain in place.  No lifting  more than 10 pounds for the next  2 days.  No restrictions on driving.  She is 2 weeks out at this point.   FOLLOWUP APPOINTMENTS:  She needs to see Dr. Ezzard Standing on Thursday July 20, 2007, at 2:00 p.m. for evaluation regarding the right lower quadrant  drain.  If drain output is less than 10 mL in a 24-hour period, the  patient may be able to go ahead and repeat CT scan and have drain  removed soon, otherwise will need to be  reevaluated in another week or  two to determine when drain can be pulled.      Allison L. Rennis Harding, N.P.      Thomas A. Cornett, M.D.  Electronically Signed    ALE/MEDQ  D:  07/13/2007  T:  07/13/2007  Job:  [id number]   cc:   Dineen Kid. Reche Dixon, M.D.

## 2010-06-30 NOTE — Op Note (Signed)
[name], [name]              ACCOUNT NO.:  [id number]   MEDICAL RECORD NO.:  [id number]          PATIENT TYPE:  INP   LOCATION:  2550                         FACILITY:  [facility]   PHYSICIAN:  Sandria Bales. Ezzard Standing, M.D.  DATE OF BIRTH:  1945/08/09   DATE OF PROCEDURE:  DATE OF DISCHARGE:                               OPERATIVE REPORT   Date of Surgery ??   PREOPERATIVE DIAGNOSIS:  Acute appendicitis.   POSTOPERATIVE DIAGNOSIS:  Acute rupture/perforated appendicitis.   PROCEDURE:  Laparoscopic appendectomy.   SURGEON:  Sandria Bales. Ezzard Standing, M.D.   ANESTHESIA:  General endotracheal.   ESTIMATED BLOOD LOSS:  Minimal.   PROCEDURE:  Ms. Hartin has had a 36-hour history of increasing right  lower quadrant abdominal pain presented to the [facility] Emergency Room  today.  A CT scan revealed an enlarged inflamed appendix consistent with  acute appendicitis.  She now comes for attempted laparoscopic  appendectomy.   The indications, potential complications of the procedure were explained  to the patient.  Potential complications include infection which I think  she already has, bleeding, open surgery, resection of the bowel, and  leak from the bowel postoperatively.   OPERATIVE NOTE:  The patient placed in the supine position with her  Foley catheter in place.  Given 1 gram of Invanz at the initiation  procedure.  Her left arm tucked with right arm out to her side.  Her  abdomen was prepped with Betadine solution and sterilely draped.   A time-out was held identifying the patient and procedure.  An  infraumbilical incision was made with sharp dissection carried down to  the abdominal cavity.  A 5-mm right upper quadrant trocar was placed, an  11-mm in the left lower quadrant for was placed, and she had the 12-mm  Hassan placed below the umbilicus.  Abdominal exploration revealed the  right and left lobes of liver unremarkable.  Gallbladder was  unremarkable.  Stomach that I could see was  unremarkable.  The bowel  that I could see was unremarkable.  She did have some adhesions in her  pelvis of small bowel from her prior hysterectomy but she had  inflammatory mass in her right lower quadrant.   The appendix was curled up, retrocecal, and a lay along the right  colonic gutter.  I had to mobilize the terminal ileum, mobilize the  right colon to get this to roll over and then I divided the base of the  appendix first and then dissected off this inflamed appendix off the  posterior wall of the cecum.  The appendix had a large phlebolith.  The  phlebolith may be as large as almost 2 cm and then that was taken.  The  whole appendix was placed in the indication bag and delivered through  the umbilicus.  I then re-inspected the appendix bed.  I saw no obvious  injury to the cecum.  The staple line at the appendiceal stump looked  good.  I irrigated the abdomen with 2 L of saline.   I then removed the trocars in turn there was no bleeding  at the trocar  site.  The umbilical port was closed with #0 Vicryl suture.  I used  approximate 20 mL of 0.25% Marcaine as a local anesthetic and closed the  skin with a 5-0 Vicryl suture painting the wound with tincture Benzoin  and steri-stripped.   The patient tolerated the procedure well and was transported to the  recovery room in good condition.      Sandria Bales. Ezzard Standing, M.D.  Electronically Signed     DHN/MEDQ  D:  07/01/2007  T:  07/01/2007  Job:  [id number]   cc:   Dineen Kid. Reche Dixon, M.D.

## 2010-11-11 LAB — CBC
HCT: 33.7 — ABNORMAL LOW
HCT: 33.9 — ABNORMAL LOW
HCT: 34.2 — ABNORMAL LOW
HCT: 36.1
HCT: 37.1
HCT: 39.8
Hemoglobin: 10.6 — ABNORMAL LOW
Hemoglobin: 11.1 — ABNORMAL LOW
Hemoglobin: 11.3 — ABNORMAL LOW
Hemoglobin: 11.4 — ABNORMAL LOW
Hemoglobin: 11.7 — ABNORMAL LOW
Hemoglobin: 12.1
Hemoglobin: 12.3
Hemoglobin: 13.2
MCHC: 33.1
MCHC: 33.1
MCHC: 33.2
MCHC: 33.2
MCHC: 33.3
MCHC: 34.2
MCV: 88.9
MCV: 89.7
MCV: 89.9
MCV: 90
MCV: 90.7
Platelets: 179
Platelets: 204
Platelets: 344
Platelets: 449 — ABNORMAL HIGH
Platelets: 558 — ABNORMAL HIGH
Platelets: 632 — ABNORMAL HIGH
RBC: 3.58 — ABNORMAL LOW
RBC: 3.74 — ABNORMAL LOW
RBC: 3.78 — ABNORMAL LOW
RBC: 3.85 — ABNORMAL LOW
RBC: 3.89
RBC: 4.01
RBC: 4.06
RBC: 4.42
RDW: 15.2
RDW: 15.5
RDW: 15.5
RDW: 15.7 — ABNORMAL HIGH
RDW: 15.8 — ABNORMAL HIGH
RDW: 15.8 — ABNORMAL HIGH
RDW: 15.9 — ABNORMAL HIGH
RDW: 16 — ABNORMAL HIGH
RDW: 16.1 — ABNORMAL HIGH
WBC: 10.3
WBC: 14.1 — ABNORMAL HIGH
WBC: 17.3 — ABNORMAL HIGH
WBC: 18.9 — ABNORMAL HIGH
WBC: 19.1 — ABNORMAL HIGH
WBC: 20.2 — ABNORMAL HIGH
WBC: 21.8 — ABNORMAL HIGH

## 2010-11-11 LAB — DIFFERENTIAL
Basophils Absolute: 0.1
Basophils Relative: 1
Eosinophils Absolute: 0
Eosinophils Absolute: 0.2
Eosinophils Relative: 0
Eosinophils Relative: 1
Lymphocytes Relative: 15
Lymphs Abs: 2.7
Lymphs Abs: 2.9
Monocytes Absolute: 1.2 — ABNORMAL HIGH
Monocytes Relative: 3
Monocytes Relative: 7
Neutro Abs: 14.7 — ABNORMAL HIGH
Neutrophils Relative %: 77

## 2010-11-11 LAB — BASIC METABOLIC PANEL WITH GFR
BUN: 2 — ABNORMAL LOW
CO2: 21
Calcium: 8.6
Chloride: 112
Creatinine, Ser: 0.65
GFR calc Af Amer: >60
GFR calc non Af Amer: >60
Glucose, Bld: 117 — ABNORMAL HIGH
Potassium: 4
Sodium: 142

## 2010-11-11 LAB — COMPREHENSIVE METABOLIC PANEL WITH GFR
ALT: 15
AST: 18
Albumin: 2.4 — ABNORMAL LOW
Alkaline Phosphatase: 98
BUN: 6
CO2: 30
Calcium: 9
Chloride: 101
Creatinine, Ser: 0.75
GFR calc Af Amer: >60
GFR calc non Af Amer: >60
Glucose, Bld: 128 — ABNORMAL HIGH
Potassium: 3.9
Sodium: 139
Total Bilirubin: 0.8
Total Protein: 6.1

## 2010-11-11 LAB — BASIC METABOLIC PANEL
CO2: 24
CO2: 25
CO2: 27
Calcium: 8.6
Calcium: 8.7
Calcium: 8.8
Chloride: 105
Creatinine, Ser: 0.7
GFR calc Af Amer: >60
GFR calc Af Amer: >60
GFR calc non Af Amer: >60
Glucose, Bld: 107 — ABNORMAL HIGH
Glucose, Bld: 190 — ABNORMAL HIGH
Potassium: 3.6
Sodium: 139
Sodium: 140
Sodium: 142

## 2010-11-11 LAB — HEPATIC FUNCTION PANEL
ALT: 14
AST: 14
Albumin: 3.6
Alkaline Phosphatase: 95
Bilirubin, Direct: 0.2
Indirect Bilirubin: 0.5
Total Bilirubin: 0.7
Total Protein: 6.8

## 2010-11-11 LAB — ANAEROBIC CULTURE

## 2010-11-11 LAB — BLOOD GAS, ARTERIAL
Acid-base deficit: 0.1
Bicarbonate: 30.4 — ABNORMAL HIGH
FIO2: 0.5
O2 Saturation: 79.7
O2 Saturation: 96.8
Patient temperature: 98.6
TCO2: 32.1
pH, Arterial: 7.375
pO2, Arterial: 41.2 — ABNORMAL LOW
pO2, Arterial: 87.7

## 2010-11-11 LAB — URINALYSIS, ROUTINE W REFLEX MICROSCOPIC
Hgb urine dipstick: NEGATIVE
Protein, ur: NEGATIVE
Urobilinogen, UA: 0.2

## 2010-11-11 LAB — CULTURE, ROUTINE-ABSCESS

## 2010-11-11 LAB — COMPREHENSIVE METABOLIC PANEL
ALT: 14
AST: 23
Calcium: 8.7
GFR calc Af Amer: >60
Sodium: 142
Total Protein: 5.7 — ABNORMAL LOW

## 2010-11-11 LAB — POCT I-STAT, CHEM 8
BUN: 6
Calcium, Ion: 1.21
Chloride: 105
Creatinine, Ser: 0.8
Glucose, Bld: 137 — ABNORMAL HIGH
HCT: 42
Hemoglobin: 14.3
Potassium: 3.3 — ABNORMAL LOW
Sodium: 142
TCO2: 26

## 2010-11-11 LAB — LIPASE, BLOOD: Lipase: 24

## 2012-07-24 ENCOUNTER — Ambulatory Visit (INDEPENDENT_AMBULATORY_CARE_PROVIDER_SITE_OTHER): Payer: Medicare Other | Admitting: Emergency Medicine

## 2012-07-24 VITALS — BP 144/84 | HR 72 | Temp 98.0°F | Resp 16 | Ht 67.25 in | Wt 146.6 lb

## 2012-07-24 DIAGNOSIS — R109 Unspecified abdominal pain: Secondary | ICD-10-CM

## 2012-07-24 DIAGNOSIS — M549 Dorsalgia, unspecified: Secondary | ICD-10-CM | POA: Diagnosis not present

## 2012-07-24 DIAGNOSIS — R350 Frequency of micturition: Secondary | ICD-10-CM

## 2012-07-24 LAB — POCT URINALYSIS DIPSTICK
Blood, UA: NEGATIVE
Ketones, UA: NEGATIVE
Protein, UA: NEGATIVE
Spec Grav, UA: <=1.005

## 2012-07-24 LAB — POCT CBC
Lymph, poc: 4.6 — AB (ref 0.6–3.4)
MCH, POC: 30.3 pg (ref 27–31.2)
MCHC: 32.3 g/dL (ref 31.8–35.4)
MID (cbc): 0.7 (ref 0–0.9)
MPV: 10.3 fL (ref 0–99.8)
POC MID %: 7 %M (ref 0–12)
Platelet Count, POC: 214 10*3/uL (ref 142–424)
RBC: 4.95 M/uL (ref 4.04–5.48)
WBC: 9.8 10*3/uL (ref 4.6–10.2)

## 2012-07-24 LAB — POCT UA - MICROSCOPIC ONLY
Bacteria, U Microscopic: NEGATIVE
Crystals, Ur, HPF, POC: NEGATIVE
Epithelial cells, urine per micros: NEGATIVE
Mucus, UA: NEGATIVE
RBC, urine, microscopic: NEGATIVE

## 2012-07-24 MED ORDER — IBUPROFEN 800 MG PO TABS: 800.0000 mg | ORAL_TABLET | Freq: Three times a day (TID) | ORAL | Status: DC | PRN

## 2012-07-24 NOTE — Progress Notes (Signed)
  Subjective:    Patient ID: [name], female    DOB: March 03, 1945, 67 y.o.   MRN: [id number]  HPI 67 year old female presents with:  1. Lower back pain x 1 week--history of arthritis and herniated disc in lower back   2. Lower abdominal pain x 1 week; no nausea, no vomiting, no diarrhea, slight constipation but patient took Carter's OTC laxative  3. Frequent urination x 1 week; no burning with urination, no stinging with urination  Symptoms worsening; partial hysterectomy No colonoscopy-patient states just didn't want to get a colonoscopy, afraid of the discomfort; patient was unaware that she will be asleep for procedure  Review of Systems     Objective:   Physical Exam patient is alert and cooperative she does not appear ill. Chest is clear to auscultation. Heart regular rate without murmurs. Abdomen is soft there are no areas of tenderness. There is no discomfort over the lower lumbar spine motor strength is symmetrical lower chimneys.  Results for orders placed in visit on 07/24/12  POCT UA - MICROSCOPIC ONLY      Result Value Range   WBC, Ur, HPF, POC neg     RBC, urine, microscopic neg     Bacteria, U Microscopic neg     Mucus, UA neg     Epithelial cells, urine per micros neg     Crystals, Ur, HPF, POC neg     Casts, Ur, LPF, POC neg     Yeast, UA neg    POCT URINALYSIS DIPSTICK      Result Value Range   Color, UA yellow     Clarity, UA clear     Glucose, UA neg     Bilirubin, UA neg     Ketones, UA neg     Spec Grav, UA <=1.005     Blood, UA neg     pH, UA 6.5     Protein, UA neg     Urobilinogen, UA negative     Nitrite, UA 0.2     Leukocytes, UA Negative    POCT CBC      Result Value Range   WBC 9.8  4.6 - 10.2 K/uL   Lymph, poc 4.6 (*) 0.6 - 3.4   POC LYMPH PERCENT 46.9  10 - 50 %L   MID (cbc) 0.7  0 - 0.9   POC MID % 7.0  0 - 12 %M   POC Granulocyte 4.5  2 - 6.9   Granulocyte percent 46.1  37 - 80 %G   RBC 4.95  4.04 - 5.48 M/uL   Hemoglobin  15.0  12.2 - 16.2 g/dL   HCT, POC 78.2  95.6 - 47.9 %   MCV 94.0  80 - 97 fL   MCH, POC 30.3  27 - 31.2 pg   MCHC 32.3  31.8 - 35.4 g/dL   RDW, POC 21.3     Platelet Count, POC 214  142 - 424 K/uL   MPV 10.3  0 - 99.8 fL        Assessment & Plan:  White count is normal. Urine is normal. The patient was advised she needs to have a colonoscopy. We'll treat with ibuprofen 800 mg .

## 2012-07-24 NOTE — Patient Instructions (Addendum)
Please call the office if you're willing and we will be happy to schedule you to see a GI specialist for colonoscopy

## 2013-04-16 ENCOUNTER — Ambulatory Visit (INDEPENDENT_AMBULATORY_CARE_PROVIDER_SITE_OTHER): Payer: Medicare Other | Admitting: Family Medicine

## 2013-04-16 VITALS — BP 144/82 | HR 83 | Temp 98.4°F | Resp 16 | Ht 65.0 in | Wt 154.8 lb

## 2013-04-16 DIAGNOSIS — J020 Streptococcal pharyngitis: Secondary | ICD-10-CM

## 2013-04-16 DIAGNOSIS — H9209 Otalgia, unspecified ear: Secondary | ICD-10-CM | POA: Diagnosis not present

## 2013-04-16 DIAGNOSIS — J329 Chronic sinusitis, unspecified: Secondary | ICD-10-CM

## 2013-04-16 LAB — POCT RAPID STREP A (OFFICE): RAPID STREP A SCREEN: NEGATIVE

## 2013-04-16 MED ORDER — AZITHROMYCIN 250 MG PO TABS: ORAL_TABLET | ORAL | Status: DC

## 2013-04-16 MED ORDER — TRAMADOL HCL 50 MG PO TABS: 50.0000 mg | ORAL_TABLET | Freq: Three times a day (TID) | ORAL | Status: DC | PRN

## 2013-04-16 NOTE — Progress Notes (Signed)
Subjective: Patient been having some postnasal drainage for a few days but today she got feeling bad. She had right ear pain. She has a sore throat and it's hard to swallow. She has not had any fever. Only a mild cough to try and clear the typical, not really coming from her chest. She does smoke about a third of a pack of cigarettes a day. She says she wants to quit.  Objective: Looks like she does not feel well. TMs normal on the left. Right she has some sweet oil in it. There is a little wax present. A portion of the drum that I can see looks noninflamed. Throat has some inflammation of the right side. Strep test taken. Neck supple without significant nodes. Chest is clear to auscultation.  Assessment: Pharyngitis Otalgia Sinusitis  Plan: Await strep test Results for orders placed in visit on 04/16/13  POCT RAPID STREP A (OFFICE)      Result Value Ref Range   Rapid Strep A Screen Negative  Negative  azithromycin

## 2013-04-16 NOTE — Patient Instructions (Signed)
Drink plenty of fluids  Take the azithromycin 2 tablets initially, then one daily for 4 days  Take tramadol one every 6 or 8 hours as needed for pain in the ear  Return if worse or not improving

## 2013-04-23 ENCOUNTER — Ambulatory Visit (INDEPENDENT_AMBULATORY_CARE_PROVIDER_SITE_OTHER): Payer: Medicare Other | Admitting: Family Medicine

## 2013-04-23 VITALS — BP 112/72 | HR 86 | Temp 98.0°F | Resp 16 | Ht 67.5 in | Wt 153.0 lb

## 2013-04-23 DIAGNOSIS — J069 Acute upper respiratory infection, unspecified: Secondary | ICD-10-CM | POA: Diagnosis not present

## 2013-04-23 DIAGNOSIS — R059 Cough, unspecified: Secondary | ICD-10-CM | POA: Diagnosis not present

## 2013-04-23 DIAGNOSIS — R05 Cough: Secondary | ICD-10-CM

## 2013-04-23 DIAGNOSIS — R0982 Postnasal drip: Secondary | ICD-10-CM

## 2013-04-23 MED ORDER — FLUTICASONE PROPIONATE 50 MCG/ACT NA SUSP
1.0000 | Freq: Every day | NASAL | Status: DC
Start: 2013-04-23 — End: 2014-12-18

## 2013-04-23 MED ORDER — BENZONATATE 100 MG PO CAPS: 100.0000 mg | ORAL_CAPSULE | Freq: Two times a day (BID) | ORAL | Status: DC | PRN

## 2013-04-23 NOTE — Progress Notes (Signed)
Chief Complaint:  Chief Complaint  Patient presents with  . Follow-up    cough x 1 week    HPI: [name] is a 68 y.o. female who is here for cough for 1 week. Coughing up sputum but still in her throat but can't get it out.  Happens during the day. She has not tried any cough medicines, has treid cough drops. Subjective fevers, no chills, no msk aches no facial pain or ear pain.  She is a smoker. She was seen on 04/16/13 and put on azithromycin for possible strep pharyngitis although rapid strep came back negative.    Past Medical History  Diagnosis Date  . Arthritis    Past Surgical History  Procedure Laterality Date  . Appendectomy     History   Social History  . Marital Status: Single    Spouse Name: N/A    Number of Children: N/A  . Years of Education: N/A   Social History Main Topics  . Smoking status: Current Every Day Smoker -- 0.50 packs/day for 40 years    Types: Cigarettes  . Smokeless tobacco: None  . Alcohol Use: No  . Drug Use: No  . Sexual Activity: Yes   Other Topics Concern  . None   Social History Narrative  . None   No family history on file. Allergies  Allergen Reactions  . Penicillins Other (See Comments)    Syncope   Prior to Admission medications   Medication Sig Start Date End Date Taking? Authorizing Provider  azithromycin (ZITHROMAX) 250 MG tablet Take 2 pills initially, then one daily for infection 04/16/13   Peyton Najjar, MD  ibuprofen (ADVIL,MOTRIN) 800 MG tablet Take 1 tablet (800 mg total) by mouth every 8 (eight) hours as needed for pain. 07/24/12   Collene Gobble, MD  traMADol (ULTRAM) 50 MG tablet Take 1 tablet (50 mg total) by mouth every 8 (eight) hours as needed. 04/16/13   Peyton Najjar, MD     ROS: The patient denies fevers, chills, night sweats, unintentional weight loss, chest pain, palpitations, wheezing, dyspnea on exertion, nausea, vomiting, abdominal pain, dysuria, hematuria, melena, numbness, weakness,  or tingling.   All other systems have been reviewed and were otherwise negative with the exception of those mentioned in the HPI and as above.    PHYSICAL EXAM: Filed Vitals:   04/23/13 0955  BP: 112/72  Pulse: 86  Temp: 98 F (36.7 C)  Resp: 16   Filed Vitals:   04/23/13 0955  Height: 5' 7.5" (1.715 m)  Weight: 153 lb (69.4 kg)   Body mass index is 23.6 kg/(m^2).  General: Alert, no acute distress HEENT:  Normocephalic, atraumatic, oropharynx patent. EOMI, PERRLA, +b oggy nares, TM nl, + PND Cardiovascular:  Regular rate and rhythm, no rubs murmurs or gallops.  No Carotid bruits, radial pulse intact. No pedal edema.  Respiratory: Clear to auscultation bilaterally.  No wheezes, rales, or rhonchi.  No cyanosis, no use of accessory musculature GI: No organomegaly, abdomen is soft and non-tender, positive bowel sounds.  No masses. Skin: No rashes. Neurologic: Facial musculature symmetric. Psychiatric: Patient is appropriate throughout our interaction. Lymphatic: No cervical lymphadenopathy Musculoskeletal: Gait intact.   LABS: Results for orders placed in visit on 04/16/13  POCT RAPID STREP A (OFFICE)      Result Value Ref Range   Rapid Strep A Screen Negative  Negative     EKG/XRAY:   [facility]   ASSESSMENT/PLAN: Encounter Diagnoses  Name Primary?  . Cough   . Post-nasal drip   . Acute upper respiratory infections of unspecified site Yes   Has finished z pack , she is feeling better but cough still lingering Told her that she recently had abx and I don;t thinks she needs another one if she is slowly improving, h/o allergies and PND Has a history of allergies nad sinus issues, wil try flonase  And also tessalon perles F/u prn  Gross sideeffects, risk and benefits, and alternatives of medications d/w patient. Patient is aware that all medications have potential sideeffects and we are unable to predict every sideeffect or drug-drug  interaction that may occur.  [name], DO 04/23/2013 11:26 AM

## 2014-12-18 ENCOUNTER — Ambulatory Visit (INDEPENDENT_AMBULATORY_CARE_PROVIDER_SITE_OTHER): Payer: Medicare Other | Admitting: Family Medicine

## 2014-12-18 ENCOUNTER — Ambulatory Visit
Admission: RE | Admit: 2014-12-18 | Discharge: 2014-12-18 | Disposition: A | Discharge status: Home / Self Care | Payer: Medicare Other | Source: Ambulatory Visit | Attending: Family Medicine | Admitting: Family Medicine

## 2014-12-18 VITALS — BP 122/70 | HR 74 | Temp 98.5°F | Resp 18 | Ht 66.0 in | Wt 147.0 lb

## 2014-12-18 DIAGNOSIS — H9312 Tinnitus, left ear: Secondary | ICD-10-CM | POA: Diagnosis not present

## 2014-12-18 DIAGNOSIS — W108XXS Fall (on) (from) other stairs and steps, sequela: Secondary | ICD-10-CM

## 2014-12-18 DIAGNOSIS — J0191 Acute recurrent sinusitis, unspecified: Secondary | ICD-10-CM | POA: Diagnosis not present

## 2014-12-18 DIAGNOSIS — H9192 Unspecified hearing loss, left ear: Secondary | ICD-10-CM

## 2014-12-18 DIAGNOSIS — R269 Unspecified abnormalities of gait and mobility: Secondary | ICD-10-CM | POA: Diagnosis not present

## 2014-12-18 DIAGNOSIS — R2689 Other abnormalities of gait and mobility: Secondary | ICD-10-CM

## 2014-12-18 DIAGNOSIS — R51 Headache: Secondary | ICD-10-CM | POA: Diagnosis not present

## 2014-12-18 DIAGNOSIS — F17200 Nicotine dependence, unspecified, uncomplicated: Secondary | ICD-10-CM | POA: Diagnosis not present

## 2014-12-18 DIAGNOSIS — R270 Ataxia, unspecified: Secondary | ICD-10-CM | POA: Diagnosis not present

## 2014-12-18 LAB — POCT CBC
GRANULOCYTE PERCENT: 42.3 % (ref 37–80)
HCT, POC: 40.6 % (ref 37.7–47.9)
Hemoglobin: 13.9 g/dL (ref 12.2–16.2)
Lymph, poc: 6 — AB (ref 0.6–3.4)
MCH, POC: 29.8 pg (ref 27–31.2)
MCHC: 34.1 g/dL (ref 31.8–35.4)
MCV: 87.4 fL (ref 80–97)
MID (CBC): 0.5 (ref 0–0.9)
MPV: 9 fL (ref 0–99.8)
PLATELET COUNT, POC: 183 10*3/uL (ref 142–424)
POC Granulocyte: 4.7 (ref 2–6.9)
POC LYMPH PERCENT: 53.6 %L — AB (ref 10–50)
POC MID %: 4.1 %M (ref 0–12)
RBC: 4.65 M/uL (ref 4.04–5.48)
RDW, POC: 15.8 %
WBC: 11.2 10*3/uL — AB (ref 4.6–10.2)

## 2014-12-18 LAB — POC MICROSCOPIC URINALYSIS (UMFC)

## 2014-12-18 LAB — POCT URINALYSIS DIP (MANUAL ENTRY)
BILIRUBIN UA: NEGATIVE
BILIRUBIN UA: NEGATIVE
GLUCOSE UA: NEGATIVE
Leukocytes, UA: NEGATIVE
Nitrite, UA: NEGATIVE
Protein Ur, POC: NEGATIVE
RBC UA: NEGATIVE
SPEC GRAV UA: 1.02
Urobilinogen, UA: 0.2
pH, UA: 5

## 2014-12-18 LAB — POCT SEDIMENTATION RATE: POCT SED RATE: 18 mm/h (ref 0–22)

## 2014-12-18 MED ORDER — FLUTICASONE PROPIONATE 50 MCG/ACT NA SUSP: 2.0000 | Freq: Every day | NASAL | Status: DC

## 2014-12-18 MED ORDER — SULFAMETHOXAZOLE-TRIMETHOPRIM 800-160 MG PO TABS: 1.0000 | ORAL_TABLET | Freq: Two times a day (BID) | ORAL | Status: DC

## 2014-12-18 NOTE — Progress Notes (Deleted)
Subjective:    Patient ID: [name], female    DOB: 1945/11/26, 69 y.o.   MRN: [id number] This chart was scribed for Norberto Sorenson, MD by Littie Deeds, Medical Scribe. This patient was seen in Room 13 and the patient's care was started at 12:04 PM.   Chief Complaint  Patient presents with   Dizziness    Onset today   Ear discomfort    Onset 3 days especially in left ear   Tinnitus    3 days   Medication Refill    Ibuprofin  for back    HPI HPI Comments: [name] is a 69 y.o. female who presents to the [facility] complaining of intermittent dizziness with changing positions that started about a week ago. This is not a room-spinning sensation. She notes that she has been staggering around her house. The dizziness is worse when standing up. She also notes that she fell while coming down cement steps outside her house, but does not know what caused her to fall; she did not hit her head. Patient reports having associated tinnitus in her left ear, lightheadedness, and congestion. She believes she may have an ear infection or a sinus infection. Patient notes that she has had decreased hearing in her left ear, but this is unchanged. She has not had her hearing checked. Patient notes that the place where she used to work was noisy, but she is retired now. She did have a headache, but she attributes this to stress. She has not tried any medications for her symptoms. Patient denies photophobia, visual changes, fever, chills, and cough.  Patient also notes that her left arm went numb yesterday. A neighbor noted to her that her speech was not making sense yesterday and was concerned she could be having a stroke. The numbness and confusion has resolved, but patient did not realize that she had been confused.  She is also requesting a refill of her ibuprofen for ongoing back pain. She states the pain was worsened with the fall last month.  No PCP per patient; she  usually comes here for primary care.  Patient has not had any regular medical care. She went to the [facility] for many years and has been seen here 3 times for URI symptoms, but no other routine medical care in likely over 5 years. Last labs were 2.5 years ago.  Review of Systems  Constitutional: Negative for fever and chills.  HENT: Positive for congestion, hearing loss and tinnitus.   Eyes: Negative for photophobia and visual disturbance.  Respiratory: Negative for cough.   Musculoskeletal: Positive for back pain.  Neurological: Positive for dizziness, speech difficulty, light-headedness, numbness and headaches.  Psychiatric/Behavioral: Positive for confusion.       Objective:  BP 122/70 mmHg   Pulse 74   Temp(Src) 98.5 F (36.9 C) (Oral)   Resp 18   Ht  (1.676 m)   Wt 147 lb (66.679 kg)   BMI 23.74 kg/m2   SpO2 98%  Physical Exam  Constitutional: She is oriented to person, place, and time. She appears well-developed and well-nourished. No distress.  HENT:  Head: Normocephalic and atraumatic.  Left Ear: Tympanic membrane normal.  Mouth/Throat: Oropharynx is clear and moist. No oropharyngeal exudate.  Retraction and mild erythema to upper inner right TM. Nares pale and boggy with purulent rhinorrhea.  Eyes: Pupils are equal, round, and reactive to light. Right eye exhibits no nystagmus. Left eye exhibits  no nystagmus.  Neck: Neck supple.  Question of left thyromegaly.  Cardiovascular: Normal rate and regular rhythm.   Murmur heard.  Systolic murmur is present with a grade of 1/6  1/6 faint systolic ejection murmur, right upper sternal border.  Pulmonary/Chest: Effort normal and breath sounds normal. No respiratory distress. She has no wheezes. She has no rales.  Musculoskeletal: She exhibits no edema.  Neurological: She is alert and oriented to person, place, and time. No cranial nerve deficit.  Positive Romberg. No pronator drift. Negative  Dix-Hallpike bilaterally. Unable to perform tandem gait at all due to imbalance. Regular gait - patient kept drifting towards the left unintentionally, even when specifically trying to stay straight, followed by overcorrection and wide-based staggering gait.  Skin: Skin is warm and dry. No rash noted.  Psychiatric: She has a normal mood and affect. Her behavior is normal.  Nursing note and vitals reviewed.         Assessment & Plan:   1. Ataxia   2. Imbalance   3. Fall (on) (from) other stairs and steps, sequela   4. Left ear hearing loss   5. Tinnitus of left ear   6. Acute recurrent sinusitis, unspecified location   7. Tobacco use disorder   SEVERE ataxia and imbalance with no recent medical care - I am very concerned pt has had a stroke. No driving and stat head CT.  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  Has also developed left hearing loss and tinnitus symptomatically so refer to ENT  Orders Placed This Encounter  Procedures   CT Head Wo Contrast    WT-147/NO PREV/NO NEEDS/EPIC ORDER//[facility] [phone] [facility]    Standing Status: Future     Number of Occurrences: 1     Standing Expiration Date: 03/19/2016    Order Specific Question:  Reason for Exam (SYMPTOM  OR DIAGNOSIS REQUIRED)    Answer:  SEVERE ATAXIA, + rhomberg, unstable gait    Order Specific Question:  Preferred imaging location?    Answer:  [facility]   Comprehensive metabolic panel   TSH   POCT CBC   POCT urinalysis dipstick   POCT Microscopic Urinalysis ([facility])   POCT SEDIMENTATION RATE   EKG 12-Lead    Meds ordered this encounter  Medications   fluticasone (FLONASE) 50 MCG/ACT nasal spray    Sig: Place 2 sprays into both nostrils at bedtime.      Dispense:  16 g    Refill:  2   sulfamethoxazole-trimethoprim (BACTRIM DS,SEPTRA DS) 800-160 MG tablet    Sig: Take 1 tablet by mouth 2 (two) times daily.    Dispense:  20 tablet    Refill:  0    I personally performed the services described in this documentation, which was scribed in my presence. The recorded information has been reviewed and considered, and addended by me as needed.  Norberto SorensonEva Shaw, MD MPH  By signing my name below, I, Littie Deedsichard Sun, attest that this documentation has been prepared under the direction and in the presence of Norberto SorensonEva Shaw, MD.  Electronically Signed: Littie Deedsichard Sun, Medical Scribe. 12/18/2014. 12:04 PM.    Ct Head Wo Contrast  12/18/2014  CLINICAL DATA:  Left side headache, confusion, severe ataxia, unsteady  gait. EXAM: CT HEAD WITHOUT CONTRAST TECHNIQUE: Contiguous axial images were obtained from the base of the skull through the vertex without intravenous contrast. COMPARISON:  None. FINDINGS: No acute intracranial abnormality. Specifically, no hemorrhage, hydrocephalus, mass lesion, acute infarction, or significant intracranial injury. No acute calvarial abnormality. Visualized paranasal sinuses and mastoids clear. Orbital soft tissues unremarkable. IMPRESSION: Normal study. Electronically Signed   By: Charlett Nose M.D.   On: 12/18/2014 15:08

## 2014-12-18 NOTE — Patient Instructions (Signed)
YOU CANNOT BE DRIVING - IT IS UNSAFE WHILE YOU HAVE THIS DIZZINESS. I WANT YOU TO RECHECK HERE ON Friday.  I DO NOT WANT TO REFILL YOUR IBUPROFEN UNTIL I SEE YOUR KIDNEY FUNCTION.  Dizziness Dizziness is a common problem. It is a feeling of unsteadiness or light-headedness. You may feel like you are about to faint. Dizziness can lead to injury if you stumble or fall. Anyone can become dizzy, but dizziness is more common in older adults. This condition can be caused by a number of things, including medicines, dehydration, or illness. HOME CARE INSTRUCTIONS Taking these steps may help with your condition: Eating and Drinking  Drink enough fluid to keep your urine clear or pale yellow. This helps to keep you from becoming dehydrated. Try to drink more clear fluids, such as water.  Do not drink alcohol.  Limit your caffeine intake if directed by your health care provider.  Limit your salt intake if directed by your health care provider. Activity  Avoid making quick movements.  Rise slowly from chairs and steady yourself until you feel okay.  In the morning, first sit up on the side of the bed. When you feel okay, stand slowly while you hold onto something until you know that your balance is fine.  Move your legs often if you need to stand in one place for a long time. Tighten and relax your muscles in your legs while you are standing.  Do not drive or operate heavy machinery if you feel dizzy.  Avoid bending down if you feel dizzy. Place items in your home so that they are easy for you to reach without leaning over. Lifestyle  Do not use any tobacco products, including cigarettes, chewing tobacco, or electronic cigarettes. If you need help quitting, ask your health care provider.  Try to reduce your stress level, such as with yoga or meditation. Talk with your health care provider if you need help. General Instructions  Watch your dizziness for any changes.  Take medicines only as  directed by your health care provider. Talk with your health care provider if you think that your dizziness is caused by a medicine that you are taking.  Tell a friend or a family member that you are feeling dizzy. If he or she notices any changes in your behavior, have this person call your health care provider.  Keep all follow-up visits as directed by your health care provider. This is important. SEEK MEDICAL CARE IF:  Your dizziness does not go away.  Your dizziness or light-headedness gets worse.  You feel nauseous.  You have reduced hearing.  You have new symptoms.  You are unsteady on your feet or you feel like the room is spinning. SEEK IMMEDIATE MEDICAL CARE IF:  You vomit or have diarrhea and are unable to eat or drink anything.  You have problems talking, walking, swallowing, or using your arms, hands, or legs.  You feel generally weak.  You are not thinking clearly or you have trouble forming sentences. It may take a friend or family member to notice this.  You have chest pain, abdominal pain, shortness of breath, or sweating.  Your vision changes.  You notice any bleeding.  You have a headache.  You have neck pain or a stiff neck.  You have a fever.   This information is not intended to replace advice given to you by your health care provider. Make sure you discuss any questions you have with your health care provider.  Document Released: 07/28/2000 Document Revised: 06/18/2014 Document Reviewed: 01/28/2014 Elsevier Interactive Patient Education 2016 [employer] Stroke Prevention Some medical conditions and behaviors are associated with an increased chance of having a stroke. You may prevent a stroke by making healthy choices and managing medical conditions. HOW CAN I REDUCE MY RISK OF HAVING A STROKE?   Stay physically active. Get at least 30 minutes of activity on most or all days.  Do not smoke. It may also be helpful to avoid exposure to  secondhand smoke.  Limit alcohol use. Moderate alcohol use is considered to be:  No more than 2 drinks per day for men.  No more than 1 drink per day for nonpregnant women.  Eat healthy foods. This involves:  Eating 5 or more servings of fruits and vegetables a day.  Making dietary changes that address high blood pressure (hypertension), high cholesterol, diabetes, or obesity.  Manage your cholesterol levels.  Making food choices that are high in fiber and low in saturated fat, trans fat, and cholesterol may control cholesterol levels.  Take any prescribed medicines to control cholesterol as directed by your health care provider.  Manage your diabetes.  Controlling your carbohydrate and sugar intake is recommended to manage diabetes.  Take any prescribed medicines to control diabetes as directed by your health care provider.  Control your hypertension.  Making food choices that are low in salt (sodium), saturated fat, trans fat, and cholesterol is recommended to manage hypertension.  Ask your health care provider if you need treatment to lower your blood pressure. Take any prescribed medicines to control hypertension as directed by your health care provider.  If you are 9318-68 years of age, have your blood pressure checked every 3-5 years. If you are 68 years of age or older, have your blood pressure checked every year.  Maintain a healthy weight.  Reducing calorie intake and making food choices that are low in sodium, saturated fat, trans fat, and cholesterol are recommended to manage weight.  Stop drug abuse.  Avoid taking birth control pills.  Talk to your health care provider about the risks of taking birth control pills if you are over 68 years old, smoke, get migraines, or have ever had a blood clot.  Get evaluated for sleep disorders (sleep apnea).  Talk to your health care provider about getting a sleep evaluation if you snore a lot or have excessive  sleepiness.  Take medicines only as directed by your health care provider.  For some people, aspirin or blood thinners (anticoagulants) are helpful in reducing the risk of forming abnormal blood clots that can lead to stroke. If you have the irregular heart rhythm of atrial fibrillation, you should be on a blood thinner unless there is a good reason you cannot take them.  Understand all your medicine instructions.  Make sure that other conditions (such as anemia or atherosclerosis) are addressed. SEEK IMMEDIATE MEDICAL CARE IF:   You have sudden weakness or numbness of the face, arm, or leg, especially on one side of the body.  Your face or eyelid droops to one side.  You have sudden confusion.  You have trouble speaking (aphasia) or understanding.  You have sudden trouble seeing in one or both eyes.  You have sudden trouble walking.  You have dizziness.  You have a loss of balance or coordination.  You have a sudden, severe headache with no known cause.  You have new chest pain or an irregular heartbeat. Any of these symptoms may  represent a serious problem that is an emergency. Do not wait to see if the symptoms will go away. Get medical help at once. Call your local emergency services (911 in [place] Do not drive yourself to the hospital.   This information is not intended to replace advice given to you by your health care provider. Make sure you discuss any questions you have with your health care provider.   Document Released: 03/11/2004 Document Revised: 02/22/2014 Document Reviewed: 08/04/2012 Elsevier Interactive Patient Education [employer]

## 2014-12-19 LAB — COMPREHENSIVE METABOLIC PANEL
ALK PHOS: 89 U/L (ref 33–130)
ALT: 7 U/L (ref 6–29)
AST: 12 U/L (ref 10–35)
Albumin: 4.1 g/dL (ref 3.6–5.1)
BILIRUBIN TOTAL: 0.4 mg/dL (ref 0.2–1.2)
BUN: 10 mg/dL (ref 7–25)
CO2: 26 mmol/L (ref 20–31)
CREATININE: 0.86 mg/dL (ref 0.50–0.99)
Calcium: 10.1 mg/dL (ref 8.6–10.4)
Chloride: 108 mmol/L (ref 98–110)
GLUCOSE: 115 mg/dL — AB (ref 65–99)
Potassium: 4.4 mmol/L (ref 3.5–5.3)
SODIUM: 141 mmol/L (ref 135–146)
Total Protein: 7.1 g/dL (ref 6.1–8.1)

## 2014-12-19 LAB — TSH: TSH: 0.927 u[IU]/mL (ref 0.350–4.500)

## 2014-12-20 ENCOUNTER — Ambulatory Visit (INDEPENDENT_AMBULATORY_CARE_PROVIDER_SITE_OTHER): Payer: Medicare Other | Admitting: Family Medicine

## 2014-12-20 VITALS — BP 108/60 | HR 65 | Temp 98.0°F | Resp 16 | Ht 66.0 in | Wt 147.0 lb

## 2014-12-20 DIAGNOSIS — J019 Acute sinusitis, unspecified: Secondary | ICD-10-CM

## 2014-12-20 NOTE — Addendum Note (Signed)
Addended by: Norberto SorensonSHAW, Walid Haig on: 12/20/2014 09:58 AM   Modules accepted: Orders

## 2014-12-20 NOTE — Progress Notes (Addendum)
Subjective:    Patient ID: [name], female    DOB: [birth year], 69 y.o.   MRN: [id number] This chart was scribed for Norberto Sorenson, MD by Littie Deeds, Medical Scribe. This patient was seen in Room 13 and the patient's care was started at 12:04 PM.   Chief Complaint  Patient presents with  . Dizziness    Onset today  . Ear discomfort    Onset 3 days especially in left ear  . Tinnitus    3 days  . Medication Refill    Ibuprofin  for back    Dizziness Associated symptoms include arthralgias, congestion, fatigue, headaches, myalgias, nausea, numbness and a sore throat. Pertinent negatives include no abdominal pain, chest pain, chills, coughing, diaphoresis, fever, joint swelling or vomiting.   HPI Comments: [name] is a 69 y.o. female who presents to the [facility] complaining of intermittent dizziness with changing positions that started about a week ago. This is not a room-spinning sensation. She notes that she has been staggering around her house. The dizziness is worse when standing up. She also notes that she fell while coming down cement steps outside her house, but does not know what caused her to fall; she did not hit her head. Patient reports having associated tinnitus in her left ear, lightheadedness, and congestion. She believes she may have an ear infection or a sinus infection. Patient notes that she has had decreased hearing in her left ear, but this is unchanged. She has not had her hearing checked. Patient notes that the place where she used to work was noisy, but she is retired now. She did have a headache, but she attributes this to stress. She has not tried any medications for her symptoms. Patient denies photophobia, visual changes, fever, chills, and cough.  Patient also notes that her left arm went numb yesterday. A neighbor noted to her that her speech was not making sense yesterday and was concerned she could be having a stroke. The  numbness and confusion has resolved, but patient did not realize that she had been confused.  She is also requesting a refill of her ibuprofen for ongoing back pain. She states the pain was worsened with the fall last month.  No PCP per patient; she usually comes here for primary care.  Patient has not had any regular medical care. She went to the unders[facility] for many years and has been seen here 3 times for URI symptoms, but no other routine medical care in likely over 5 years. Last labs were 2.5 years ago.  Past Medical History  Diagnosis Date  . Arthritis    Past Surgical History  Procedure Laterality Date  . Appendectomy     No current outpatient prescriptions on file prior to visit.   No current facility-administered medications on file prior to visit.   Allergies  Allergen Reactions  . Penicillins Other (See Comments)    Syncope   No family history on file. Social History   Social History  . Marital Status: Single    Spouse Name: N/A  . Number of Children: N/A  . Years of Education: N/A   Social History Main Topics  . Smoking status: Current Every Day Smoker -- 0.50 packs/day for 40 years    Types: Cigarettes  . Smokeless tobacco: None  . Alcohol Use: No  . Drug Use: No  . Sexual Activity: Yes   Other Topics Concern  . None  Social History Narrative    Review of Systems  Constitutional: Positive for activity change and fatigue. Negative for fever, chills, diaphoresis, appetite change and unexpected weight change.  HENT: Positive for congestion, ear pain, facial swelling, hearing loss, postnasal drip, rhinorrhea, sinus pressure, sore throat and tinnitus. Negative for drooling, ear discharge, mouth sores, trouble swallowing and voice change.   Eyes: Negative for photophobia and visual disturbance.  Respiratory: Positive for chest tightness. Negative for cough, shortness of breath and wheezing.   Cardiovascular: Negative for chest  pain, palpitations and leg swelling.  Gastrointestinal: Positive for nausea. Negative for vomiting, abdominal pain and abdominal distention.  Genitourinary: Negative for dysuria, urgency, hematuria and difficulty urinating.  Musculoskeletal: Positive for myalgias, back pain, arthralgias and gait problem. Negative for joint swelling.  Neurological: Positive for dizziness, tremors, speech difficulty, light-headedness, numbness and headaches. Negative for seizures, syncope and facial asymmetry.  Hematological: Negative for adenopathy.  Psychiatric/Behavioral: Positive for confusion. Negative for hallucinations, self-injury and agitation. The patient is not hyperactive.        Objective:  BP 122/70 mmHg  Pulse 74  Temp(Src) 98.5 F (36.9 C) (Oral)  Resp 18  Ht 5\' 6"  (1.676 m)  Wt 147 lb (66.679 kg)  BMI 23.74 kg/m2  SpO2 98%  Physical Exam  Constitutional: She is oriented to person, place, and time. She appears well-developed and well-nourished. No distress.  HENT:  Head: Normocephalic and atraumatic.  Left Ear: Tympanic membrane normal.  Mouth/Throat: Oropharynx is clear and moist. No oropharyngeal exudate.  Retraction and mild erythema to upper inner right TM. Nares pale and boggy with purulent rhinorrhea.  Eyes: Pupils are equal, round, and reactive to light. Right eye exhibits no nystagmus. Left eye exhibits no nystagmus.  Neck: Neck supple.  Question of left thyromegaly.  Cardiovascular: Normal rate and regular rhythm.   Murmur heard.  Systolic murmur is present with a grade of 1/6  1/6 faint systolic ejection murmur, right upper sternal border.  Pulmonary/Chest: Effort normal and breath sounds normal. No respiratory distress. She has no wheezes. She has no rales.  Musculoskeletal: She exhibits no edema.  Neurological: She is alert and oriented to person, place, and time. No cranial nerve deficit.  Positive Romberg. No pronator drift. Negative Dix-Hallpike bilaterally. Unable to  perform tandem gait at all due to imbalance. Regular gait - patient kept drifting towards the left unintentionally, even when specifically trying to stay straight, followed by overcorrection and wide-based staggering gait.  Skin: Skin is warm and dry. No rash noted.  Psychiatric: She has a normal mood and affect. Her behavior is normal.  Nursing note and vitals reviewed.  [facility] reading (PRIMARY) by  Dr. Clelia CroftShaw. EKG: NSR, no acute ischemic change     Assessment & Plan:   1. Ataxia   2. Imbalance   3. Fall (on) (from) other stairs and steps, sequela   4. Left ear hearing loss   5. Tinnitus of left ear   6. Acute recurrent sinusitis, unspecified location   7. Tobacco use disorder   SEVERE ataxia and imbalance with no recent medical care - I am very concerned pt has had a stroke. No driving and stat head CT.  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  Has  also developed left hearing loss and tinnitus symptomatically so refer to ENT  Orders Placed This Encounter  Procedures  . CT Head Wo Contrast    WT-147/NO PREV/NO NEEDS/EPIC ORDER//[facility] [phone] [facility]    Standing Status: Future     Number of Occurrences: 1     Standing Expiration Date: 03/19/2016    Order Specific Question:  Reason for Exam (SYMPTOM  OR DIAGNOSIS REQUIRED)    Answer:  SEVERE ATAXIA, + rhomberg, unstable gait    Order Specific Question:  Preferred imaging location?    Answer:  [facility]  . Comprehensive metabolic panel  . TSH  . Ambulatory referral to ENT    Referral Priority:  Routine    Referral Type:  Consultation    Referral Reason:  Specialty Services Required    Requested Specialty:  Otolaryngology    Number of Visits Requested:  1  . Ambulatory referral  to Neurology    Referral Priority:  Urgent    Referral Type:  Consultation    Referral Reason:  Specialty Services Required    Requested Specialty:  Neurology    Number of Visits Requested:  1  . POCT CBC  . POCT urinalysis dipstick  . POCT Microscopic Urinalysis ([facility])  . POCT SEDIMENTATION RATE  . EKG 12-Lead    Meds ordered this encounter  Medications  . fluticasone (FLONASE) 50 MCG/ACT nasal spray    Sig: Place 2 sprays into both nostrils at bedtime.    Dispense:  16 g    Refill:  2  . sulfamethoxazole-trimethoprim (BACTRIM DS,SEPTRA DS) 800-160 MG tablet    Sig: Take 1 tablet by mouth 2 (two) times daily.    Dispense:  20 tablet    Refill:  0   Over 40 min spent in face-to-face evaluation of and consultation with patient and coordination of care.  Over 50% of this time was spent counseling this patient.  I personally performed the services described in this documentation, which was scribed in my presence. The recorded information has been reviewed and considered, and addended by me as needed.  Norberto Sorenson, MD MPH  By signing my name below, I, Littie Deeds, attest that this documentation has been prepared under the direction and in the presence of Norberto Sorenson, MD.  Electronically Signed: Littie Deeds, Medical Scribe. 12/20/2014. 9:19 AM.    Ct Head Wo Contrast  12/18/2014  CLINICAL DATA:  Left side headache, confusion, severe ataxia, unsteady gait. EXAM: CT HEAD WITHOUT CONTRAST TECHNIQUE: Contiguous axial images were obtained from the base of the skull through the vertex without intravenous contrast. COMPARISON:  None. FINDINGS: No acute intracranial abnormality. Specifically, no hemorrhage, hydrocephalus, mass lesion, acute infarction, or significant intracranial injury. No acute calvarial abnormality. Visualized paranasal sinuses and mastoids clear. Orbital soft tissues unremarkable. IMPRESSION: Normal study. Electronically Signed   By: Charlett Nose M.D.   On: 12/18/2014 15:08

## 2014-12-20 NOTE — Progress Notes (Signed)
Subjective:    Patient ID: [name], female    DOB: [birth year], 69 y.o.   MRN: [id number] This chart was scribed for Norberto Sorenson, MD by Littie Deeds, Medical Scribe. This patient was seen in Room 10 and the patient's care was started at 9:48 AM.   Chief Complaint  Patient presents with  . Follow-up    CT scan and bloodwirk done on Tuesday    HPI HPI Comments: [name] is a 69 y.o. female who presents to the [facility] for a follow-up. She was seen by me 2 days ago for ear discomfort, tinnitus, dizziness, and imbalance; she had been sent out for stat CT Head due to concern for CVA, but fortunately imaging was normal. Patient did start on the Bactrim and the nasal spray and has been feeling much better, but still has some ear discomfort. She no longer has tinnitus. Her balance has improved; she still has some dizziness if she spins around too fast, but is fine if she takes her time. She has been drinking more water.  No PCP per patient  Past Medical History  Diagnosis Date  . Arthritis    Past Surgical History  Procedure Laterality Date  . Appendectomy     Current Outpatient Prescriptions on File Prior to Visit  Medication Sig Dispense Refill  . fluticasone (FLONASE) 50 MCG/ACT nasal spray Place 2 sprays into both nostrils at bedtime. 16 g 2  . sulfamethoxazole-trimethoprim (BACTRIM DS,SEPTRA DS) 800-160 MG tablet Take 1 tablet by mouth 2 (two) times daily. 20 tablet 0   No current facility-administered medications on file prior to visit.   Allergies  Allergen Reactions  . Penicillins Other (See Comments)    Syncope   No family history on file. Social History   Social History  . Marital Status: Single    Spouse Name: N/A  . Number of Children: N/A  . Years of Education: N/A   Social History Main Topics  . Smoking status: Current Every Day Smoker -- 0.50 packs/day for 40 years    Types: Cigarettes  . Smokeless tobacco: None  . Alcohol  Use: No  . Drug Use: No  . Sexual Activity: Yes   Other Topics Concern  . None   Social History Narrative     Review of Systems  HENT: Positive for ear pain. Negative for tinnitus.   Neurological: Positive for dizziness.       Objective:  BP 108/60 mmHg  Pulse 65  Temp(Src) 98 F (36.7 C) (Oral)  Resp 16  Ht  (1.676 m)  Wt 147 lb (66.679 kg)  BMI 23.74 kg/m2  SpO2 97%  Physical Exam  Constitutional: She is oriented to person, place, and time. She appears well-developed and well-nourished. No distress.  HENT:  Head: Normocephalic and atraumatic.  Right Ear: Tympanic membrane is retracted.  Left Ear: Tympanic membrane is retracted.  Mouth/Throat: Oropharynx is clear and moist. No oropharyngeal exudate.  Turbinates pale and boggy. Oropharynx clear.  Eyes: Pupils are equal, round, and reactive to light.  Neck: Neck supple. No thyromegaly present.  Thyroid normal.  Cardiovascular: Normal rate, regular rhythm, S1 normal, S2 normal and normal heart sounds.   Pulmonary/Chest: Effort normal and breath sounds normal. No respiratory distress. She has no wheezes. She has no rales.  Clear to auscultation bilaterally.   Musculoskeletal: She exhibits no edema.  Lymphadenopathy:    She has no cervical adenopathy.  Neurological: She is alert and  oriented to person, place, and time. No cranial nerve deficit.  Skin: Skin is warm and dry. No rash noted.  Psychiatric: She has a normal mood and affect. Her behavior is normal.  Nursing note and vitals reviewed.  Question of clubbing per triager      Assessment & Plan:  Acute sinusitis, recurrence not specified, unspecified location Significantly improved - continue bactrim. Stop smoking   I personally performed the services described in this documentation, which was scribed in my presence. The recorded information has been reviewed and considered, and addended by me as needed.  Norberto SorensonEva Malicia Blasdel, MD MPH   By signing my name below,  I, Littie Deedsichard Sun, attest that this documentation has been prepared under the direction and in the presence of Norberto SorensonEva Salif Tay, MD.  Electronically Signed: Littie Deedsichard Sun, Medical Scribe. 12/20/2014. 9:48 AM.

## 2015-09-13 ENCOUNTER — Encounter (HOSPITAL_COMMUNITY): Payer: Self-pay

## 2015-09-13 ENCOUNTER — Emergency Department (HOSPITAL_COMMUNITY)
Admission: EM | Admit: 2015-09-13 | Discharge: 2015-09-14 | Disposition: A | Discharge status: Home / Self Care | Payer: Medicare Other | Attending: Emergency Medicine | Admitting: Emergency Medicine

## 2015-09-13 DIAGNOSIS — M62830 Muscle spasm of back: Secondary | ICD-10-CM | POA: Diagnosis not present

## 2015-09-13 DIAGNOSIS — M1990 Unspecified osteoarthritis, unspecified site: Secondary | ICD-10-CM | POA: Insufficient documentation

## 2015-09-13 DIAGNOSIS — M546 Pain in thoracic spine: Secondary | ICD-10-CM | POA: Diagnosis present

## 2015-09-13 DIAGNOSIS — F17210 Nicotine dependence, cigarettes, uncomplicated: Secondary | ICD-10-CM | POA: Insufficient documentation

## 2015-09-13 MED ORDER — CYCLOBENZAPRINE HCL 10 MG PO TABS: 10.0000 mg | ORAL_TABLET | Freq: Two times a day (BID) | ORAL | 0 refills | Status: DC | PRN

## 2015-09-13 MED ORDER — HYDROCODONE-ACETAMINOPHEN 5-325 MG PO TABS: 1.0000 | ORAL_TABLET | Freq: Four times a day (QID) | ORAL | 0 refills | Status: DC | PRN

## 2015-09-13 MED ORDER — HYDROCODONE-ACETAMINOPHEN 5-325 MG PO TABS
1.0000 | ORAL_TABLET | Freq: Once | ORAL | Status: AC
  Administered 2015-09-14: 1 via ORAL
  Filled 2015-09-13: qty 1

## 2015-09-13 NOTE — ED Provider Notes (Signed)
[facility]-EMERGENCY DEPT Provider Note   CSN: [id number] Arrival date & time: 09/13/15  2332  First Provider Contact:  First MD Initiated Contact with Patient 09/13/15 2353        History   Chief Complaint Chief Complaint  Patient presents with  . Back Pain    HPI [name] is a 70 y.o. female.  Patient presents to the ED with a chief complaint of right sided back pain.  She states that she was moving furniture recently.  She denies any bowel or bladder incontinence.  Denies any weakness, numbness, or tingling. States that she tried taking arthritis medicine with no relief.  There are no modifying factors.   The history is provided by the patient. No language interpreter was used.    Past Medical History:  Diagnosis Date  . Arthritis     There are no active problems to display for this patient.   Past Surgical History:  Procedure Laterality Date  . APPENDECTOMY      OB History    No data available       Home Medications    Prior to Admission medications   Medication Sig Start Date End Date Taking? Authorizing Provider  fluticasone (FLONASE) 50 MCG/ACT nasal spray Place 2 sprays into both nostrils at bedtime. 12/18/14   Sherren Mocha, MD  sulfamethoxazole-trimethoprim (BACTRIM DS,SEPTRA DS) 800-160 MG tablet Take 1 tablet by mouth 2 (two) times daily. 12/18/14   Sherren Mocha, MD    Family History History reviewed. No pertinent family history.  Social History Social History  Substance Use Topics  . Smoking status: Current Every Day Smoker    Packs/day: 0.50    Years: 40.00    Types: Cigarettes  . Smokeless tobacco: Never Used  . Alcohol use No     Allergies   Penicillins   Review of Systems Review of Systems  Constitutional: Negative for chills and fever.  Gastrointestinal:       No bowel incontinence  Genitourinary:       No urinary incontinence  Musculoskeletal: Positive for arthralgias, back pain and myalgias.  Neurological:       No saddle  anesthesia  All other systems reviewed and are negative.    Physical Exam Updated Vital Signs BP 138/85 (BP Location: Right Arm)   Pulse 78   Temp 98.5 F (36.9 C) (Oral)   Resp 20   SpO2 99%   Physical Exam Physical Exam  Constitutional: Pt appears well-developed and well-nourished. No distress.  HENT:  Head: Normocephalic and atraumatic.  Mouth/Throat: Oropharynx is clear and moist. No oropharyngeal exudate.  Eyes: Conjunctivae are normal.  Neck: Normal range of motion. Neck supple.  No meningismus Cardiovascular: Normal rate, regular rhythm and intact distal pulses.   Pulmonary/Chest: Effort normal and breath sounds normal. No respiratory distress. Pt has no wheezes.  Abdominal: Pt exhibits no distension Musculoskeletal:  Right rhomboids tender to palpation, no bony CTLS spine tenderness, deformity, step-off, or crepitus Lymphadenopathy: Pt has no cervical adenopathy.  Neurological: Pt is alert and oriented Speech is clear and goal oriented, follows commands Normal 5/5 strength in upper and lower extremities bilaterally including dorsiflexion and plantar flexion, strong and equal grip strength Sensation intact Great toe extension intact Moves extremities without ataxia, coordination intact Normal gait Normal balance Skin: Skin is warm and dry. No rash noted. Pt is not diaphoretic. No erythema.  Psychiatric: Pt has a normal mood and affect. Behavior is normal.  Nursing note and vitals reviewed.  ED Treatments / Results  Labs (all labs ordered are listed, but only abnormal results are displayed) Labs Reviewed - No data to display  EKG  EKG Interpretation None       Radiology No results found.  Procedures Procedures (including critical care time)  Medications Ordered in ED Medications  HYDROcodone-acetaminophen (NORCO/VICODIN) 5-325 MG per tablet 1 tablet (not administered)     Initial Impression / Assessment and Plan / ED Course  I have reviewed  the triage vital signs and the nursing notes.  Pertinent labs & imaging results that were available during my care of the patient were reviewed by me and considered in my medical decision making (see chart for details).  Clinical Course    Patient with back pain.  No neurological deficits and normal neuro exam.  Patient is ambulatory.  No loss of bowel or bladder control.  Doubt cauda equina.  Denies fever,  doubt epidural abscess or other lesion. Recommend back exercises, stretching, RICE, and will treat with a short course of flexeril and norco.  Encouraged the patient that there could be a need for additional workup and/or imaging such as MRI, if the symptoms do not resolve. Patient advised that if the back pain does not resolve, or radiates, this could progress to more serious conditions and is encouraged to follow-up with PCP or orthopedics within 2 weeks.     Final Clinical Impressions(s) / ED Diagnoses   Final diagnoses:  Muscle spasm of back  Right-sided thoracic back pain    New Prescriptions New Prescriptions   No medications on file     Roxy Horseman, PA-C 09/14/15 0000    Gerhard Munch, MD 09/14/15 [phone]

## 2015-09-13 NOTE — ED Triage Notes (Addendum)
Pt c/o mid level R sided back pain since this morning. States that the pain is starting to cause shortness of breath. Ambulatory in triage. A&Ox4.

## 2016-03-11 ENCOUNTER — Ambulatory Visit (INDEPENDENT_AMBULATORY_CARE_PROVIDER_SITE_OTHER): Payer: Medicare Other | Admitting: Emergency Medicine

## 2016-03-11 VITALS — BP 138/40 | HR 70 | Temp 98.4°F | Resp 16 | Ht 66.0 in | Wt 151.0 lb

## 2016-03-11 DIAGNOSIS — R109 Unspecified abdominal pain: Secondary | ICD-10-CM

## 2016-03-11 DIAGNOSIS — R10A0 Flank pain, unspecified side: Secondary | ICD-10-CM

## 2016-03-11 DIAGNOSIS — M7918 Myalgia, other site: Secondary | ICD-10-CM

## 2016-03-11 DIAGNOSIS — M791 Myalgia: Secondary | ICD-10-CM

## 2016-03-11 LAB — POCT URINALYSIS DIP (MANUAL ENTRY)
BILIRUBIN UA: NEGATIVE
BILIRUBIN UA: NEGATIVE
Blood, UA: NEGATIVE
Glucose, UA: NEGATIVE
LEUKOCYTES UA: NEGATIVE
Nitrite, UA: NEGATIVE
Protein Ur, POC: NEGATIVE
Spec Grav, UA: 1.01
Urobilinogen, UA: 0.2
pH, UA: 6

## 2016-03-11 LAB — POC MICROSCOPIC URINALYSIS (UMFC): MUCUS RE: ABSENT

## 2016-03-11 MED ORDER — DICLOFENAC SODIUM 75 MG PO TBEC: 75.0000 mg | DELAYED_RELEASE_TABLET | Freq: Two times a day (BID) | ORAL | 0 refills | Status: DC

## 2016-03-11 NOTE — Patient Instructions (Signed)
Back Pain, Adult Introduction Back pain is very common. The pain often gets better over time. The cause of back pain is usually not dangerous. Most people can learn to manage their back pain on their own. Follow these instructions at home: Watch your back pain for any changes. The following actions may help to lessen any pain you are feeling:  Stay active. Start with short walks on flat ground if you can. Try to walk farther each day.  Exercise regularly as told by your doctor. Exercise helps your back heal faster. It also helps avoid future injury by keeping your muscles strong and flexible.  Do not sit, drive, or stand in one place for more than 30 minutes.  Do not stay in bed. Resting more than 1-2 days can slow down your recovery.  Be careful when you bend or lift an object. Use good form when lifting:  Bend at your knees.  Keep the object close to your body.  Do not twist.  Sleep on a firm mattress. Lie on your side, and bend your knees. If you lie on your back, put a pillow under your knees.  Take medicines only as told by your doctor.  Put ice on the injured area.  Put ice in a plastic bag.  Place a towel between your skin and the bag.  Leave the ice on for 20 minutes, 2-3 times a day for the first 2-3 days. After that, you can switch between ice and heat packs.  Avoid feeling anxious or stressed. Find good ways to deal with stress, such as exercise.  Maintain a healthy weight. Extra weight puts stress on your back. Contact a doctor if:  You have pain that does not go away with rest or medicine.  You have worsening pain that goes down into your legs or buttocks.  You have pain that does not get better in one week.  You have pain at night.  You lose weight.  You have a fever or chills. Get help right away if:  You cannot control when you poop (bowel movement) or pee (urinate).  Your arms or legs feel weak.  Your arms or legs lose feeling  (numbness).  You feel sick to your stomach (nauseous) or throw up (vomit).  You have belly (abdominal) pain.  You feel like you may pass out (faint). This information is not intended to replace advice given to you by your health care provider. Make sure you discuss any questions you have with your health care provider. Document Released: 07/21/2007 Document Revised: 07/10/2015 Document Reviewed: 06/05/2013  2017 [employer]  

## 2016-03-11 NOTE — Progress Notes (Signed)
Dorothy GentryRoberta M Gonzalez 71 y.o.   Chief Complaint  Patient presents with  . Back Pain    x2 weeks  . Flank Pain    right side pain; she states she can not tell if its an UTI or not    HISTORY OF PRESENT ILLNESS: This is a 71 y.o. female slipped and bumped right lower back/flank against hard surface, c/o pain since; denies UTI symptoms but wants to make sure she doesn't have one.[name].  HPI   Prior to Admission medications   Medication Sig Start Date End Date Taking? Authorizing Provider  cyclobenzaprine (FLEXERIL) 10 MG tablet Take 1 tablet (10 mg total) by mouth 2 (two) times daily as needed for muscle spasms. Patient not taking: Reported on 03/11/2016 09/13/15   [name], PA-C  diclofenac (VOLTAREN) 75 MG EC tablet Take 1 tablet (75 mg total) by mouth 2 (two) times daily. 03/11/16   [name], MD    Allergies  Allergen Reactions  . Penicillins Other (See Comments)    Syncope    There are no active problems to display for this patient.   Past Medical History:  Diagnosis Date  . Arthritis     Past Surgical History:  Procedure Laterality Date  . APPENDECTOMY      Social History   Social History  . Marital status: Single    Spouse name: N/A  . Number of children: N/A  . Years of education: N/A   Occupational History  . Not on file.   Social History Main Topics  . Smoking status: Current Every Day Smoker    Packs/day: 0.50    Years: 40.00    Types: Cigarettes  . Smokeless tobacco: Never Used  . Alcohol use No  . Drug use: No  . Sexual activity: Yes   Other Topics Concern  . Not on file   Social History Narrative  . No narrative on file    History reviewed. No pertinent family history.   Review of Systems  Constitutional: Negative.   HENT: Negative.   Eyes: Negative.   Respiratory: Negative.   Cardiovascular: Negative.   Gastrointestinal: Negative for abdominal pain, nausea and vomiting.  Genitourinary: Negative for dysuria, frequency,  hematuria and urgency.  Musculoskeletal: Positive for back pain (right lumbar).  Skin: Negative.   Neurological: Negative.   Endo/Heme/Allergies: Negative.   Psychiatric/Behavioral: Negative.   All other systems reviewed and are negative.  Vitals:   03/11/16 1701  BP: (!) 138/40  Pulse: 70  Resp: 16  Temp: 98.4 F (36.9 C)     Physical Exam  Constitutional: She is oriented to person, place, and time. She appears well-developed and well-nourished.  HENT:  Head: Normocephalic and atraumatic.  Eyes: EOM are normal. Pupils are equal, round, and reactive to light.  Neck: Normal range of motion. Neck supple.  Cardiovascular: Normal rate and regular rhythm.   Pulmonary/Chest: Effort normal and breath sounds normal.  Abdominal: Soft. She exhibits no distension. There is no tenderness.  Musculoskeletal: Normal range of motion.       Lumbar back: She exhibits tenderness and pain.       Back:  Neurological: She is alert and oriented to person, place, and time.  Skin: Skin is warm and dry. Capillary refill takes less than 2 seconds.  Psychiatric: She has a normal mood and affect. Her behavior is normal.  Vitals reviewed.    ASSESSMENT & PLAN: [name] was seen today for back pain and flank pain.  Diagnoses and  all orders for this visit:  Flank pain -     POCT urinalysis dipstick -     POCT Microscopic Urinalysis ([facility])  Musculoskeletal pain  Other orders -     diclofenac (VOLTAREN) 75 MG EC tablet; Take 1 tablet (75 mg total) by mouth 2 (two) times daily.    Patient Instructions  Back Pain, Adult Introduction Back pain is very common. The pain often gets better over time. The cause of back pain is usually not dangerous. Most people can learn to manage their back pain on their own. Follow these instructions at home: Watch your back pain for any changes. The following actions may help to lessen any pain you are feeling:  Stay active. Start with short walks on flat ground  if you can. Try to walk farther each day.  Exercise regularly as told by your doctor. Exercise helps your back heal faster. It also helps avoid future injury by keeping your muscles strong and flexible.  Do not sit, drive, or stand in one place for more than 30 minutes.  Do not stay in bed. Resting more than 1-2 days can slow down your recovery.  Be careful when you bend or lift an object. Use good form when lifting:  Bend at your knees.  Keep the object close to your body.  Do not twist.  Sleep on a firm mattress. Lie on your side, and bend your knees. If you lie on your back, put a pillow under your knees.  Take medicines only as told by your doctor.  Put ice on the injured area.  Put ice in a plastic bag.  Place a towel between your skin and the bag.  Leave the ice on for 20 minutes, 2-3 times a day for the first 2-3 days. After that, you can switch between ice and heat packs.  Avoid feeling anxious or stressed. Find good ways to deal with stress, such as exercise.  Maintain a healthy weight. Extra weight puts stress on your back. Contact a doctor if:  You have pain that does not go away with rest or medicine.  You have worsening pain that goes down into your legs or buttocks.  You have pain that does not get better in one week.  You have pain at night.  You lose weight.  You have a fever or chills. Get help right away if:  You cannot control when you poop (bowel movement) or pee (urinate).  Your arms or legs feel weak.  Your arms or legs lose feeling (numbness).  You feel sick to your stomach (nauseous) or throw up (vomit).  You have belly (abdominal) pain.  You feel like you may pass out (faint). This information is not intended to replace advice given to you by your health care provider. Make sure you discuss any questions you have with your health care provider. Document Released: 07/21/2007 Document Revised: 07/10/2015 Document Reviewed: 06/05/2013   2017 Elsevier      Edwina Barth, MD [facility]

## 2016-04-04 ENCOUNTER — Emergency Department (HOSPITAL_COMMUNITY)
Admission: EM | Admit: 2016-04-04 | Discharge: 2016-04-04 | Disposition: A | Discharge status: Home / Self Care | Payer: Medicare Other | Attending: Emergency Medicine | Admitting: Emergency Medicine

## 2016-04-04 ENCOUNTER — Encounter (HOSPITAL_COMMUNITY): Payer: Self-pay

## 2016-04-04 DIAGNOSIS — J029 Acute pharyngitis, unspecified: Secondary | ICD-10-CM | POA: Diagnosis present

## 2016-04-04 DIAGNOSIS — R05 Cough: Secondary | ICD-10-CM | POA: Diagnosis not present

## 2016-04-04 DIAGNOSIS — Z79899 Other long term (current) drug therapy: Secondary | ICD-10-CM | POA: Diagnosis not present

## 2016-04-04 DIAGNOSIS — J069 Acute upper respiratory infection, unspecified: Secondary | ICD-10-CM | POA: Insufficient documentation

## 2016-04-04 DIAGNOSIS — F17210 Nicotine dependence, cigarettes, uncomplicated: Secondary | ICD-10-CM | POA: Insufficient documentation

## 2016-04-04 NOTE — ED Triage Notes (Signed)
Patient c/o sore throat, bilateral ear pain, and a non-productive cough that started 2 days ago.

## 2016-04-06 NOTE — ED Provider Notes (Signed)
[facility]-EMERGENCY DEPT Provider Note   CSN: [id number] Arrival date & time: 04/04/16  [id number]     History   Chief Complaint Chief Complaint  Patient presents with  . Sore Throat  . Cough  . Otalgia    HPI [name] is a 71 y.o. female.  HPI Patient presents to the emergency department sore throat, cough, Tylenol earache over the past 48 hours.  No recent sick contacts.  No fevers or chills.  No difficulty breathing or swallowing.  No shortness of breath.  No unilateral leg swelling.  She feels stuffy in her nose.  Symptoms are mild in severity.  She wants to be certain she does not have flu  Past Medical History:  Diagnosis Date  . Arthritis     Patient Active Problem List   Diagnosis Date Noted  . Flank pain 03/11/2016    Past Surgical History:  Procedure Laterality Date  . ABDOMINAL HYSTERECTOMY    . APPENDECTOMY      OB History    No data available       Home Medications    Prior to Admission medications   Medication Sig Start Date End Date Taking? Authorizing Provider  cyclobenzaprine (FLEXERIL) 10 MG tablet Take 1 tablet (10 mg total) by mouth 2 (two) times daily as needed for muscle spasms. Patient not taking: Reported on 03/11/2016 09/13/15   [name], PA-C  diclofenac (VOLTAREN) 75 MG EC tablet Take 1 tablet (75 mg total) by mouth 2 (two) times daily. 03/11/16   Georgina QuintMiguel Jose Sagardia, MD    Family History History reviewed. No pertinent family history.  Social History Social History  Substance Use Topics  . Smoking status: Current Every Day Smoker    Packs/day: 0.15    Years: 40.00    Types: Cigarettes  . Smokeless tobacco: Never Used  . Alcohol use No     Allergies   Penicillins   Review of Systems Review of Systems  All other systems reviewed and are negative.    Physical Exam Updated Vital Signs BP 132/86 (BP Location: Right Arm)   Pulse 82   Temp 97.8 F (36.6 C) (Oral)   Resp 18   Ht 5\' 6"  (1.676 m)   Wt 151 lb  (68.5 kg)   SpO2 100%   BMI 24.37 kg/m   Physical Exam  Constitutional: She is oriented to person, place, and time. She appears well-developed and well-nourished. No distress.  HENT:  Head: Normocephalic and atraumatic.  Bilateral TMs are normal.  Posterior pharynx is normal.  Uvula is normal and midline  Eyes: EOM are normal.  Neck: Normal range of motion.  Cardiovascular: Normal rate, regular rhythm and normal heart sounds.   Pulmonary/Chest: Effort normal and breath sounds normal.  Abdominal: Soft. She exhibits no distension. There is no tenderness.  Musculoskeletal: Normal range of motion.  Neurological: She is alert and oriented to person, place, and time.  Skin: Skin is warm and dry.  Psychiatric: She has a normal mood and affect. Judgment normal.  Nursing note and vitals reviewed.    ED Treatments / Results  Labs (all labs ordered are listed, but only abnormal results are displayed) Labs Reviewed - No data to display  EKG  EKG Interpretation None       Radiology No results found.  Procedures Procedures (including critical care time)  Medications Ordered in ED Medications - No data to display   Initial Impression / Assessment and Plan / ED Course  I  have reviewed the triage vital signs and the nursing notes.  Pertinent labs & imaging results that were available during my care of the patient were reviewed by me and considered in my medical decision making (see chart for details).     Patient is overall well-appearing.  Suspect viral upper respiratory tract infection.  Close primary care follow-up.  No indication for additional workup.  Final Clinical Impressions(s) / ED Diagnoses   Final diagnoses:  Upper respiratory tract infection, unspecified type    New Prescriptions Discharge Medication List as of 04/04/2016  8:40 AM       Azalia Bilis, MD 04/06/16 0111

## 2017-01-20 ENCOUNTER — Emergency Department (HOSPITAL_COMMUNITY): Payer: Medicare Other

## 2017-01-20 ENCOUNTER — Encounter (HOSPITAL_COMMUNITY): Payer: Self-pay | Admitting: Emergency Medicine

## 2017-01-20 ENCOUNTER — Emergency Department (HOSPITAL_COMMUNITY)
Admission: EM | Admit: 2017-01-20 | Discharge: 2017-01-20 | Disposition: A | Discharge status: Home / Self Care | Payer: Medicare Other | Attending: Emergency Medicine | Admitting: Emergency Medicine

## 2017-01-20 DIAGNOSIS — S62650S Nondisplaced fracture of medial phalanx of right index finger, sequela: Secondary | ICD-10-CM | POA: Diagnosis not present

## 2017-01-20 DIAGNOSIS — S60940A Unspecified superficial injury of right index finger, initial encounter: Secondary | ICD-10-CM | POA: Diagnosis present

## 2017-01-20 DIAGNOSIS — Y999 Unspecified external cause status: Secondary | ICD-10-CM | POA: Diagnosis not present

## 2017-01-20 DIAGNOSIS — Y929 Unspecified place or not applicable: Secondary | ICD-10-CM | POA: Insufficient documentation

## 2017-01-20 DIAGNOSIS — X58XXXA Exposure to other specified factors, initial encounter: Secondary | ICD-10-CM | POA: Diagnosis not present

## 2017-01-20 DIAGNOSIS — Y939 Activity, unspecified: Secondary | ICD-10-CM | POA: Diagnosis not present

## 2017-01-20 DIAGNOSIS — S62650A Nondisplaced fracture of medial phalanx of right index finger, initial encounter for closed fracture: Secondary | ICD-10-CM | POA: Insufficient documentation

## 2017-01-20 DIAGNOSIS — F17210 Nicotine dependence, cigarettes, uncomplicated: Secondary | ICD-10-CM | POA: Insufficient documentation

## 2017-01-20 DIAGNOSIS — S6991XA Unspecified injury of right wrist, hand and finger(s), initial encounter: Secondary | ICD-10-CM | POA: Diagnosis not present

## 2017-01-20 DIAGNOSIS — M7989 Other specified soft tissue disorders: Secondary | ICD-10-CM | POA: Diagnosis not present

## 2017-01-20 DIAGNOSIS — M79644 Pain in right finger(s): Secondary | ICD-10-CM | POA: Diagnosis not present
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
## 2017-01-20 NOTE — Discharge Instructions (Signed)
Continue to wear splint.  Take Tylenol or Motrin for pain which ever you prefer.  Please follow-up with a hand surgeon.  If continue to have issues with using your finger or pain.

## 2017-01-28 DIAGNOSIS — S63630A Sprain of interphalangeal joint of right index finger, initial encounter: Secondary | ICD-10-CM | POA: Diagnosis not present

## 2017-01-31 DIAGNOSIS — M79644 Pain in right finger(s): Secondary | ICD-10-CM | POA: Diagnosis not present

## 2017-01-31 DIAGNOSIS — M25441 Effusion, right hand: Secondary | ICD-10-CM | POA: Diagnosis not present

## 2017-01-31 DIAGNOSIS — S63630D Sprain of interphalangeal joint of right index finger, subsequent encounter: Secondary | ICD-10-CM | POA: Diagnosis not present

## 2017-02-01 DIAGNOSIS — S63630D Sprain of interphalangeal joint of right index finger, subsequent encounter: Secondary | ICD-10-CM | POA: Diagnosis not present

## 2017-02-01 DIAGNOSIS — M79644 Pain in right finger(s): Secondary | ICD-10-CM | POA: Diagnosis not present

## 2017-02-01 DIAGNOSIS — M25441 Effusion, right hand: Secondary | ICD-10-CM | POA: Diagnosis not present

## 2017-02-14 DIAGNOSIS — S63630D Sprain of interphalangeal joint of right index finger, subsequent encounter: Secondary | ICD-10-CM | POA: Diagnosis not present

## 2017-06-13 ENCOUNTER — Other Ambulatory Visit: Payer: Self-pay

## 2017-06-13 ENCOUNTER — Encounter: Payer: Self-pay | Admitting: Physician Assistant

## 2017-06-13 ENCOUNTER — Ambulatory Visit (INDEPENDENT_AMBULATORY_CARE_PROVIDER_SITE_OTHER): Payer: Medicare Other | Admitting: Physician Assistant

## 2017-06-13 VITALS — BP 150/96 | HR 76 | Temp 98.0°F | Resp 16 | Ht 66.5 in | Wt 149.8 lb

## 2017-06-13 DIAGNOSIS — F4321 Adjustment disorder with depressed mood: Secondary | ICD-10-CM

## 2017-06-13 DIAGNOSIS — R030 Elevated blood-pressure reading, without diagnosis of hypertension: Secondary | ICD-10-CM | POA: Diagnosis not present

## 2017-06-13 DIAGNOSIS — R21 Rash and other nonspecific skin eruption: Secondary | ICD-10-CM | POA: Diagnosis not present

## 2017-06-13 MED ORDER — DOXYCYCLINE HYCLATE 100 MG PO CAPS: 100.0000 mg | ORAL_CAPSULE | Freq: Two times a day (BID) | ORAL | 0 refills | Status: AC

## 2017-06-13 MED ORDER — HYDROXYZINE HCL 10 MG PO TABS: 10.0000 mg | ORAL_TABLET | Freq: Three times a day (TID) | ORAL | 0 refills | Status: AC | PRN

## 2017-06-13 NOTE — Progress Notes (Signed)
06/15/2017 10:19 AM   DOB: [birth year] / MRN: [id number]  SUBJECTIVE:  [name] is a 72 y.o. female presenting for rash about the face.  She says this is new for her.  She denies any pain and does report some mild itching.  Tells me that she was spraying the house with a bug spray and some of this landed on her face and since that time she has been having some symptoms.  Denies any new products, soaps.  She lost her daughter to metastatic breast cancer a few months ago and tells me that "I thought I was handling this well but I may not be."  She wonders if the stress may be contributing to the rash.  She tells me she does not have anyone to talk to about her daughter's death nor she interested in treatment for her mood with regard to medication.  She does tell me that she is waking up early in the morning and will try to find something to do until eventually she will go back to sleep.  She is never been this way before.  She admits to feelings of tearfulness that are occurring quite often.  She has also stopped walking which she used to enjoy.  She is not interested in the treatment of depression or referral to a counselor at this time.  She denies SI and HI at this time.  She is allergic to penicillins.   She  has a past medical history of Arthritis.    She  reports that she has been smoking cigarettes.  She has a 6.00 pack-year smoking history. She has never used smokeless tobacco. She reports that she does not drink alcohol or use drugs. She  reports that she currently engages in sexual activity. The patient  has a past surgical history that includes Appendectomy and Abdominal hysterectomy.  Her family history is not on file.  Review of Systems  Constitutional: Negative for chills, diaphoresis and fever.  Eyes: Negative.   Respiratory: Negative for cough, hemoptysis, sputum production, shortness of breath and wheezing.   Cardiovascular: Negative for chest pain, orthopnea and leg  swelling.  Gastrointestinal: Negative for abdominal pain, blood in stool, constipation, diarrhea, heartburn, melena, nausea and vomiting.  Genitourinary: Negative for dysuria, flank pain, frequency, hematuria and urgency.  Skin: Positive for itching and rash.  Neurological: Negative for dizziness, sensory change, speech change, focal weakness and headaches.    The problem list and medications were reviewed and updated by myself where necessary and exist elsewhere in the encounter.   OBJECTIVE:  BP (!) 150/96 (BP Location: Left Arm, Patient Position: Sitting, Cuff Size: Normal)   Pulse 76   Temp 98 F (36.7 C) (Oral)   Resp 16   Ht 5' 6.5" (1.689 m)   Wt 149 lb 12.8 oz (67.9 kg)   SpO2 95%   BMI 23.82 kg/m   Physical Exam  Constitutional: She is oriented to person, place, and time. She appears well-developed.  HENT:  Head:    Eyes: Pupils are equal, round, and reactive to light. EOM are normal.  Cardiovascular: Normal rate.  Pulmonary/Chest: Effort normal.  Abdominal: She exhibits no distension.  Musculoskeletal: Normal range of motion.  Neurological: She is alert and oriented to person, place, and time. No cranial nerve deficit.  Skin: Skin is warm and dry. Rash noted. She is not diaphoretic.  Psychiatric: She has a normal mood and affect.  Vitals reviewed.   No results found for this  or any previous visit (from the past 72 hour(s)).  No results found.  ASSESSMENT AND PLAN:  [name] was seen today for rash.  Diagnoses and all orders for this visit:  Rash and nonspecific skin eruption: It appears she is having some acne.  This is new for her.  Given the low risk of side effects I will treat her with doxycycline and given her complaint of itching I will give her some hydroxyzine. -     hydrOXYzine (ATARAX/VISTARIL) 10 MG tablet; Take 1 tablet (10 mg total) by mouth 3 (three) times daily as needed. -     doxycycline (VIBRAMYCIN) 100 MG capsule; Take 1 capsule (100 mg  total) by mouth 2 (two) times daily for 10 days.  Elevated BP without diagnosis of hypertension: She will monitor her blood pressure and come back into see her primary care provider Dr. Clelia Croft in the next 2 to 3 weeks.  Grief: I feel most certain that she is depressed and per HPI with good reason.  I have asked her to at least reach out to someone to talk about how she has been feeling.  I have asked her to bring this up with Dr. Clelia Croft at her appointment in the next 2 to 3 weeks.   The patient is advised to call or return to clinic if she does not see an improvement in symptoms, or to seek the care of the closest emergency department if she worsens with the above plan.   Deliah Boston, MHS, PA-C [facility] at [facility] 06/15/2017 10:19 AM

## 2017-06-13 NOTE — Patient Instructions (Addendum)
Keep a log of your daily BP.  Write down the numbers and come back and see Dr. Clelia Croft.     IF you received an x-ray today, you will receive an invoice from [facility]. Please contact [facility] at [phone] with questions or concerns regarding your invoice.   IF you received labwork today, you will receive an invoice from [place]. Please contact [facility] at [phone] with questions or concerns regarding your invoice.   Our billing staff will not be able to assist you with questions regarding bills from these companies.  You will be contacted with the lab results as soon as they are available. The fastest way to get your results is to activate your My Chart account. Instructions are located on the last page of this paperwork. If you have not heard from [place] regarding the results in 2 weeks, please contact this office.

## 2017-06-24 ENCOUNTER — Telehealth: Payer: Self-pay | Admitting: Family Medicine

## 2017-06-24 DIAGNOSIS — N898 Other specified noninflammatory disorders of vagina: Secondary | ICD-10-CM

## 2017-06-24 NOTE — Telephone Encounter (Signed)
Call to patient- patient states she has developed itching with discharge after taking antibiotic. Patient is requesting oral treatment for this- it has been years since she has had a yeast infection- she was advised if she did not get response today- she could use OTC yeast treatment. She is aware- but would prefer oral treatment.Will forward request to provider for consideration.

## 2017-06-24 NOTE — Telephone Encounter (Signed)
Please advise 

## 2017-06-24 NOTE — Telephone Encounter (Signed)
Diflucan 150 mg once okay if topical fails.

## 2017-06-24 NOTE — Telephone Encounter (Signed)
Copied from CRM [id number]. Topic: Quick Communication - See Telephone Encounter >> Jun 24, 2017  7:14 AM Waymon Amato wrote: Pt was prescribed by Dr. Chestine Spore and antibiotic and she has completed them but now she has developed a yeast infection and would like something called in   Best number [phone]  [facility] -[address]

## 2017-06-24 NOTE — Telephone Encounter (Signed)
LVM to call back for instructions. Crm done.

## 2017-06-25 MED ORDER — FLUCONAZOLE 150 MG PO TABS: 150.0000 mg | ORAL_TABLET | Freq: Once | ORAL | 0 refills | Status: AC

## 2017-06-25 NOTE — Telephone Encounter (Signed)
Incoming call from patient. Relayed message from Deliah Boston. She verbalizes understanding, states that she doesn't have antifungal medication at her pharmacy. Per notes from provider, fluconazole  sent. Patient appreciative of call.

## 2017-08-17 ENCOUNTER — Other Ambulatory Visit: Payer: Self-pay

## 2017-08-17 ENCOUNTER — Ambulatory Visit (INDEPENDENT_AMBULATORY_CARE_PROVIDER_SITE_OTHER): Payer: Medicare Other | Admitting: Emergency Medicine

## 2017-08-17 ENCOUNTER — Encounter: Payer: Self-pay | Admitting: Emergency Medicine

## 2017-08-17 VITALS — HR 81 | Temp 98.5°F | Resp 16 | Ht 65.75 in | Wt 145.2 lb

## 2017-08-17 DIAGNOSIS — M549 Dorsalgia, unspecified: Secondary | ICD-10-CM | POA: Insufficient documentation

## 2017-08-17 DIAGNOSIS — S39012A Strain of muscle, fascia and tendon of lower back, initial encounter: Secondary | ICD-10-CM | POA: Diagnosis not present

## 2017-08-17 DIAGNOSIS — M62838 Other muscle spasm: Secondary | ICD-10-CM | POA: Insufficient documentation

## 2017-08-17 DIAGNOSIS — N898 Other specified noninflammatory disorders of vagina: Secondary | ICD-10-CM

## 2017-08-17 DIAGNOSIS — M7918 Myalgia, other site: Secondary | ICD-10-CM | POA: Diagnosis not present

## 2017-08-17 LAB — POCT URINALYSIS DIP (MANUAL ENTRY)
Bilirubin, UA: NEGATIVE
Blood, UA: NEGATIVE
Glucose, UA: NEGATIVE mg/dL
Ketones, POC UA: NEGATIVE mg/dL
LEUKOCYTES UA: NEGATIVE
Nitrite, UA: NEGATIVE
PROTEIN UA: NEGATIVE mg/dL
Spec Grav, UA: <=1.005 — AB (ref 1.010–1.025)
UROBILINOGEN UA: 0.2 U/dL
pH, UA: 6 (ref 5.0–8.0)

## 2017-08-17 MED ORDER — DICLOFENAC SODIUM 75 MG PO TBEC: 75.0000 mg | DELAYED_RELEASE_TABLET | Freq: Two times a day (BID) | ORAL | 0 refills | Status: AC

## 2017-08-17 MED ORDER — CYCLOBENZAPRINE HCL 10 MG PO TABS: 10.0000 mg | ORAL_TABLET | Freq: Every day | ORAL | 0 refills | Status: AC

## 2017-08-17 MED ORDER — FLUCONAZOLE 150 MG PO TABS: 150.0000 mg | ORAL_TABLET | Freq: Once | ORAL | 0 refills | Status: AC

## 2017-08-17 NOTE — Patient Instructions (Addendum)
     IF you received an x-ray today, you will receive an invoice from [facility]. Please contact [facility] at [phone] with questions or concerns regarding your invoice.   IF you received labwork today, you will receive an invoice from [facility]. Please contact [facility] at [phone] with questions or concerns regarding your invoice.   Our billing staff will not be able to assist you with questions regarding bills from these companies.  You will be contacted with the lab results as soon as they are available. The fastest way to get your results is to activate your My Chart account. Instructions are located on the last page of this paperwork. If you have not heard from us regarding the results in 2 weeks, please contact this office.      Back Pain, Adult Back pain is very common. The pain often gets better over time. The cause of back pain is usually not dangerous. Most people can learn to manage their back pain on their own. Follow these instructions at home: Watch your back pain for any changes. The following actions may help to lessen any pain you are feeling:  Stay active. Start with short walks on flat ground if you can. Try to walk farther each day.  Exercise regularly as told by your doctor. Exercise helps your back heal faster. It also helps avoid future injury by keeping your muscles strong and flexible.  Do not sit, drive, or stand in one place for more than 30 minutes.  Do not stay in bed. Resting more than 1-2 days can slow down your recovery.  Be careful when you bend or lift an object. Use good form when lifting: ? Bend at your knees. ? Keep the object close to your body. ? Do not twist.  Sleep on a firm mattress. Lie on your side, and bend your knees. If you lie on your back, put a pillow under your knees.  Take medicines only as told by your doctor.  Put ice on the injured area. ? Put ice in a plastic bag. ? Place a towel between your  skin and the bag. ? Leave the ice on for 20 minutes, 2-3 times a day for the first 2-3 days. After that, you can switch between ice and heat packs.  Avoid feeling anxious or stressed. Find good ways to deal with stress, such as exercise.  Maintain a healthy weight. Extra weight puts stress on your back.  Contact a doctor if:  You have pain that does not go away with rest or medicine.  You have worsening pain that goes down into your legs or buttocks.  You have pain that does not get better in one week.  You have pain at night.  You lose weight.  You have a fever or chills. Get help right away if:  You cannot control when you poop (bowel movement) or pee (urinate).  Your arms or legs feel weak.  Your arms or legs lose feeling (numbness).  You feel sick to your stomach (nauseous) or throw up (vomit).  You have belly (abdominal) pain.  You feel like you may pass out (faint). This information is not intended to replace advice given to you by your health care provider. Make sure you discuss any questions you have with your health care provider. Document Released: 07/21/2007 Document Revised: 07/10/2015 Document Reviewed: 06/05/2013 Elsevier Interactive Patient Education  2018 Elsevier Inc.  

## 2017-08-17 NOTE — Progress Notes (Signed)
[name] 72 y.o.   Chief Complaint  Patient presents with  . Back Pain    x 1 week  . Vaginal Itching    will discuss matter 1 1/2 weeks    HISTORY OF PRESENT ILLNESS: This is a 72 y.o. female complaining of low back pain that started about 1 week ago after moving heavy objects in the kitchen.  Denies neurological symptoms. Also complaining of some vaginal itching with white discharge for the past 1 to 2 weeks.  Back Pain  This is a new problem. The current episode started 1 to 4 weeks ago. The problem occurs constantly. The problem is unchanged. The pain is present in the lumbar spine. The quality of the pain is described as aching. The pain does not radiate. The pain is at a severity of 7/10. The pain is moderate. The pain is the same all the time. The symptoms are aggravated by bending, position and twisting. Pertinent negatives include no abdominal pain, bladder incontinence, bowel incontinence, chest pain, dysuria, fever, headaches, leg pain, numbness, paresis, paresthesias, pelvic pain, perianal numbness, tingling or weakness. Risk factors: none. She has tried nothing for the symptoms. The treatment provided no relief.     Prior to Admission medications   Medication Sig Start Date End Date Taking? Authorizing Provider  cyclobenzaprine (FLEXERIL) 10 MG tablet Take 1 tablet (10 mg total) by mouth at bedtime. 08/17/17   Georgina Quint, MD  diclofenac (VOLTAREN) 75 MG EC tablet Take 1 tablet (75 mg total) by mouth 2 (two) times daily for 5 days. After 5 days take as needed. 08/17/17 08/22/17  Georgina Quint, MD  fluconazole (DIFLUCAN) 150 MG tablet Take 1 tablet (150 mg total) by mouth once for 1 dose. 08/17/17 08/17/17  Georgina Quint, MD  hydrOXYzine (ATARAX/VISTARIL) 10 MG tablet Take 1 tablet (10 mg total) by mouth 3 (three) times daily as needed. 06/13/17   Ofilia Neas, PA-C    Allergies  Allergen Reactions  . Penicillins Other (See Comments)    Syncope      Patient Active Problem List   Diagnosis Date Noted  . Flank pain 03/11/2016    Past Medical History:  Diagnosis Date  . Arthritis     Past Surgical History:  Procedure Laterality Date  . ABDOMINAL HYSTERECTOMY    . APPENDECTOMY      Social History   Socioeconomic History  . Marital status: Single    Spouse name: Not on file  . Number of children: Not on file  . Years of education: Not on file  . Highest education level: Not on file  Occupational History  . Not on file  Social Needs  . Financial resource strain: Not on file  . Food insecurity:    Worry: Not on file    Inability: Not on file  . Transportation needs:    Medical: Not on file    Non-medical: Not on file  Tobacco Use  . Smoking status: Current Every Day Smoker    Packs/day: 0.15    Years: 40.00    Pack years: 6.00    Types: Cigarettes  . Smokeless tobacco: Never Used  Substance and Sexual Activity  . Alcohol use: No  . Drug use: No  . Sexual activity: Yes  Lifestyle  . Physical activity:    Days per week: Not on file    Minutes per session: Not on file  . Stress: Not on file  Relationships  . Social connections:  Talks on phone: Not on file    Gets together: Not on file    Attends religious service: Not on file    Active member of club or organization: Not on file    Attends meetings of clubs or organizations: Not on file    Relationship status: Not on file  . Intimate partner violence:    Fear of current or ex partner: Not on file    Emotionally abused: Not on file    Physically abused: Not on file    Forced sexual activity: Not on file  Other Topics Concern  . Not on file  Social History Narrative  . Not on file    No family history on file.   Review of Systems  Constitutional: Negative.  Negative for fever and malaise/fatigue.  HENT: Negative.  Negative for sore throat.   Eyes: Negative.  Negative for blurred vision and double vision.  Respiratory: Negative.  Negative  for cough, hemoptysis and shortness of breath.   Cardiovascular: Negative.  Negative for chest pain and palpitations.  Gastrointestinal: Negative for abdominal pain, blood in stool, bowel incontinence, diarrhea, nausea and vomiting.  Genitourinary: Negative for bladder incontinence, dysuria, hematuria and pelvic pain.  Musculoskeletal: Positive for back pain.  Skin: Negative.   Neurological: Negative for tingling, sensory change, focal weakness, weakness, numbness, headaches and paresthesias.  Endo/Heme/Allergies: Negative.   All other systems reviewed and are negative.  Vitals:   08/17/17 1155  Pulse: 81  Resp: 16  Temp: 98.5 F (36.9 C)  SpO2: 99%     Physical Exam  Constitutional: She is oriented to person, place, and time. She appears well-developed and well-nourished.  HENT:  Head: Normocephalic and atraumatic.  Mouth/Throat: Oropharynx is clear and moist.  Eyes: Pupils are equal, round, and reactive to light. Conjunctivae and EOM are normal.  Neck: Normal range of motion. Neck supple.  Cardiovascular: Normal rate and regular rhythm.  Pulmonary/Chest: Effort normal and breath sounds normal.  Abdominal: Soft. Bowel sounds are normal. She exhibits no distension. There is no tenderness.  Musculoskeletal: She exhibits no edema.       Lumbar back: She exhibits decreased range of motion, tenderness and spasm. She exhibits no bony tenderness, no swelling and normal pulse.  Neurological: She is alert and oriented to person, place, and time. She displays normal reflexes. No sensory deficit. She exhibits normal muscle tone. Coordination normal.  Skin: Skin is warm and dry. Capillary refill takes less than 2 seconds. No rash noted.  Psychiatric: She has a normal mood and affect. Her behavior is normal.  Vitals reviewed.   Results for orders placed or performed in visit on 08/17/17 (from the past 24 hour(s))  POCT urinalysis dipstick     Status: Abnormal   Collection Time: 08/17/17  12:24 PM  Result Value Ref Range   Color, UA yellow yellow   Clarity, UA clear clear   Glucose, UA negative negative mg/dL   Bilirubin, UA negative negative   Ketones, POC UA negative negative mg/dL   Spec Grav, UA <=1.610 (A) 1.010 - 1.025   Blood, UA negative negative   pH, UA 6.0 5.0 - 8.0   Protein Ur, POC negative negative mg/dL   Urobilinogen, UA 0.2 0.2 or 1.0 E.U./dL   Nitrite, UA Negative Negative   Leukocytes, UA Negative Negative    ASSESSMENT & PLAN: Naara was seen today for back pain and vaginal itching.  Diagnoses and all orders for this visit:  Acute back pain, unspecified back  location, unspecified back pain laterality -     POCT urinalysis dipstick  Vaginal itching -     fluconazole (DIFLUCAN) 150 MG tablet; Take 1 tablet (150 mg total) by mouth once for 1 dose.  Strain of lumbar region, initial encounter  Muscle spasm -     cyclobenzaprine (FLEXERIL) 10 MG tablet; Take 1 tablet (10 mg total) by mouth at bedtime.  Musculoskeletal pain -     diclofenac (VOLTAREN) 75 MG EC tablet; Take 1 tablet (75 mg total) by mouth 2 (two) times daily for 5 days. After 5 days take as needed.    Patient Instructions       IF you received an x-ray today, you will receive an invoice from [facility]. Please contact [facility] at [phone] with questions or concerns regarding your invoice.   IF you received labwork today, you will receive an invoice from [facility]. Please contact [facility] at [phone] with questions or concerns regarding your invoice.   Our billing staff will not be able to assist you with questions regarding bills from these companies.  You will be contacted with the lab results as soon as they are available. The fastest way to get your results is to activate your My Chart account. Instructions are located on the last page of this paperwork. If you have not heard from us regarding the results in 2 weeks, please contact this  office.     Back Pain, Adult Back pain is very common. The pain often gets better over time. The cause of back pain is usually not dangerous. Most people can learn to manage their back pain on their own. Follow these instructions at home: Watch your back pain for any changes. The following actions may help to lessen any pain you are feeling:  Stay active. Start with short walks on flat ground if you can. Try to walk farther each day.  Exercise regularly as told by your doctor. Exercise helps your back heal faster. It also helps avoid future injury by keeping your muscles strong and flexible.  Do not sit, drive, or stand in one place for more than 30 minutes.  Do not stay in bed. Resting more than 1-2 days can slow down your recovery.  Be careful when you bend or lift an object. Use good form when lifting: ? Bend at your knees. ? Keep the object close to your body. ? Do not twist.  Sleep on a firm mattress. Lie on your side, and bend your knees. If you lie on your back, put a pillow under your knees.  Take medicines only as told by your doctor.  Put ice on the injured area. ? Put ice in a plastic bag. ? Place a towel between your skin and the bag. ? Leave the ice on for 20 minutes, 2-3 times a day for the first 2-3 days. After that, you can switch between ice and heat packs.  Avoid feeling anxious or stressed. Find good ways to deal with stress, such as exercise.  Maintain a healthy weight. Extra weight puts stress on your back.  Contact a doctor if:  You have pain that does not go away with rest or medicine.  You have worsening pain that goes down into your legs or buttocks.  You have pain that does not get better in one week.  You have pain at night.  You lose weight.  You have a fever or chills. Get help right away if:  You cannot control when  you poop (bowel movement) or pee (urinate).  Your arms or legs feel weak.  Your arms or legs lose feeling  (numbness).  You feel sick to your stomach (nauseous) or throw up (vomit).  You have belly (abdominal) pain.  You feel like you may pass out (faint). This information is not intended to replace advice given to you by your health care provider. Make sure you discuss any questions you have with your health care provider. Document Released: 07/21/2007 Document Revised: 07/10/2015 Document Reviewed: 06/05/2013 Elsevier Interactive Patient Education  2018 Elsevier Inc.      Edwina Barth, MD [facility]

## 2018-03-29 ENCOUNTER — Telehealth: Payer: Self-pay | Admitting: Family Medicine

## 2018-03-29 NOTE — Telephone Encounter (Signed)
Called and spoke with pt regarding their appt with Dr. Clelia Croft on 2/19 for a CPE. Due to Dr. Clelia Croft being on leave, appt is cancelled. Pt states that she will call back the beginning of March and make the appt. Please schedule with Dr. Clelia Croft for a CPE. Thank you!

## 2018-04-05 ENCOUNTER — Encounter: Payer: Medicare Other | Admitting: Family Medicine

## 2018-06-05 ENCOUNTER — Ambulatory Visit: Payer: Self-pay

## 2018-06-07 ENCOUNTER — Ambulatory Visit (INDEPENDENT_AMBULATORY_CARE_PROVIDER_SITE_OTHER): Payer: Medicare Other | Admitting: Family Medicine

## 2018-06-07 ENCOUNTER — Other Ambulatory Visit: Payer: Self-pay

## 2018-06-07 VITALS — BP 133/82 | Ht 66.0 in | Wt 145.0 lb

## 2018-06-07 DIAGNOSIS — Z1231 Encounter for screening mammogram for malignant neoplasm of breast: Secondary | ICD-10-CM | POA: Diagnosis not present

## 2018-06-07 DIAGNOSIS — Z0000 Encounter for general adult medical examination without abnormal findings: Secondary | ICD-10-CM

## 2018-06-07 DIAGNOSIS — Z13820 Encounter for screening for osteoporosis: Secondary | ICD-10-CM

## 2018-06-07 NOTE — Progress Notes (Signed)
Presents today for [employer] Annual Wellness Visit   Date of last exam: 08/17/2017  Interpreter used for this visit? no  Patient Care Team: Sherren MochaShaw, Eva N, MD as PCP - General (Family Medicine)   Other items to address today:  Patient will schedule Mammogram  Discussed eye/dental yearly exams Discussed Shingrix,PNA 13 vaccine Discussed booking Colonoscopy  TDAP declined due to insurance  Patient was advised needs to establish care.    ADVANCE DIRECTIVES: Discussed:yes On File: no Materials Provided  yes  Immunization status:   There is no immunization history on file for this patient.   Health Maintenance Due  Topic Date Due  . COLONOSCOPY  08/28/1996  . DEXA SCAN  08/29/2011     Functional Status Survey: Is the patient deaf or have difficulty hearing?: No Does the patient have difficulty seeing, even when wearing glasses/contacts?: No Does the patient have difficulty concentrating, remembering, or making decisions?: No Does the patient have difficulty walking or climbing stairs?: No Does the patient have difficulty dressing or bathing?: No Does the patient have difficulty doing errands alone such as visiting a doctor's office or shopping?: No   6CIT Screen 06/07/2018  What Year? 0 points  What month? 0 points  What time? 0 points  Count back from 20 0 points  Months in reverse 0 points  Repeat phrase 0 points  Total Score 0        Clinical Support from 06/07/2018 in [facility]  AUDIT-C Score  2       Home Environment:   One story home lives alone  No trouble climbing stairs Yes to [employer] No scatter rugs. Well lit.    Patient Active Problem List   Diagnosis Date Noted  . Acute back pain 08/17/2017  . Vaginal itching 08/17/2017  . Muscle spasm 08/17/2017  . Musculoskeletal pain 08/17/2017  . Strain of lumbar region 08/17/2017  . Flank pain 03/11/2016     Past Medical History:  Diagnosis Date  . Arthritis       Past Surgical History:  Procedure Laterality Date  . ABDOMINAL HYSTERECTOMY    . APPENDECTOMY       No family history on file.   Social History   Socioeconomic History  . Marital status: Single    Spouse name: Not on file  . Number of children: Not on file  . Years of education: Not on file  . Highest education level: Not on file  Occupational History  . Not on file  Social Needs  . Financial resource strain: Not on file  . Food insecurity:    Worry: Not on file    Inability: Not on file  . Transportation needs:    Medical: Not on file    Non-medical: Not on file  Tobacco Use  . Smoking status: Current Every Day Smoker    Packs/day: 0.15    Years: 40.00    Pack years: 6.00    Types: Cigarettes  . Smokeless tobacco: Never Used  Substance and Sexual Activity  . Alcohol use: No  . Drug use: No  . Sexual activity: Yes  Lifestyle  . Physical activity:    Days per week: Not on file    Minutes per session: Not on file  . Stress: Not on file  Relationships  . Social connections:    Talks on phone: Not on file    Gets together: Not on file    Attends religious service: Not on  file    Active member of club or organization: Not on file    Attends meetings of clubs or organizations: Not on file    Relationship status: Not on file  . Intimate partner violence:    Fear of current or ex partner: Not on file    Emotionally abused: Not on file    Physically abused: Not on file    Forced sexual activity: Not on file  Other Topics Concern  . Not on file  Social History Narrative  . Not on file     Allergies  Allergen Reactions  . Penicillins Other (See Comments)    Syncope     Prior to Admission medications   Medication Sig Start Date End Date Taking? Authorizing Provider  cyclobenzaprine (FLEXERIL) 10 MG tablet Take 1 tablet (10 mg total) by mouth at bedtime. 08/17/17   Georgina Quint, MD  hydrOXYzine (ATARAX/VISTARIL) 10 MG tablet Take 1 tablet (10 mg  total) by mouth 3 (three) times daily as needed. 06/13/17   Ofilia Neas, PA-C     Depression screen [facility] 2/9 06/07/2018 08/17/2017 06/13/2017 03/11/2016 12/20/2014  Decreased Interest 0 0 0 0 0  Down, Depressed, Hopeless 0 0 0 0 0  PHQ - 2 Score 0 0 0 0 0     Fall Risk  06/07/2018 08/17/2017 06/13/2017 03/11/2016 12/18/2014  Falls in the past year? 0 Yes No No Yes  Number falls in past yr: 0 1 - - 1  Injury with Fall? 0 No - - Yes  Comment - - - - Knees swollen and skinned      PHYSICAL EXAM: BP 133/82   Ht 5\' 6"  (1.676 m)   Wt 145 lb (65.8 kg)   BMI 23.40 kg/m    Wt Readings from Last 3 Encounters:  06/07/18 145 lb (65.8 kg)  08/17/17 145 lb 3.2 oz (65.9 kg)  06/13/17 149 lb 12.8 oz (67.9 kg)     No exam data present    Physical Exam   Education/Counseling provided regarding diet and exercise, prevention of chronic diseases, smoking/tobacco cessation, if applicable, and reviewed "Covered Medicare Preventive Services."   ASSESSMENT/PLAN: 1. Screening for osteoporosis  2. Screening mammogram, encounter for

## 2018-07-03 NOTE — Progress Notes (Signed)
Presents today for [employer] Annual Wellness Visit  I connected with  Dorothy GentryRoberta M Gonzalez on 07/03/18 by a video enabled telemedicine application and verified that I am speaking with the correct person using two identifiers.   The patient expressed understanding and agreed to proceed.    Date of last exam: 08/17/2017  Interpreter used for this visit? no  Patient Care Team: Sherren MochaShaw, Eva N, MD as PCP - General (Family Medicine)   Other items to address today:  Patient will schedule Mammogram  Discussed eye/dental yearly exams Discussed Shingrix,PNA 13 vaccine Discussed booking Colonoscopy  TDAP declined due to insurance  Patient was advised needs to establish care.    ADVANCE DIRECTIVES: Discussed:yes On File: no Materials Provided  yes  Immunization status:   There is no immunization history on file for this patient.   Health Maintenance Due  Topic Date Due  . COLONOSCOPY  08/28/1996  . DEXA SCAN  08/29/2011     Functional Status Survey: Is the patient deaf or have difficulty hearing?: No Does the patient have difficulty seeing, even when wearing glasses/contacts?: No Does the patient have difficulty concentrating, remembering, or making decisions?: No Does the patient have difficulty walking or climbing stairs?: No Does the patient have difficulty dressing or bathing?: No Does the patient have difficulty doing errands alone such as visiting a doctor's office or shopping?: No   6CIT Screen 06/07/2018  What Year? 0 points  What month? 0 points  What time? 0 points  Count back from 20 0 points  Months in reverse 0 points  Repeat phrase 0 points  Total Score 0        Office Visit from 06/07/2018 in [facility]  AUDIT-C Score  2       Home Environment:   One story home lives alone  No trouble climbing stairs Yes to [employer] No scatter rugs. Well lit.    Patient Active Problem List   Diagnosis Date Noted  . Acute back pain  08/17/2017  . Vaginal itching 08/17/2017  . Muscle spasm 08/17/2017  . Musculoskeletal pain 08/17/2017  . Strain of lumbar region 08/17/2017  . Flank pain 03/11/2016     Past Medical History:  Diagnosis Date  . Arthritis      Past Surgical History:  Procedure Laterality Date  . ABDOMINAL HYSTERECTOMY    . APPENDECTOMY       No family history on file.   Social History   Socioeconomic History  . Marital status: Single    Spouse name: Not on file  . Number of children: Not on file  . Years of education: Not on file  . Highest education level: Not on file  Occupational History  . Not on file  Social Needs  . Financial resource strain: Not on file  . Food insecurity:    Worry: Not on file    Inability: Not on file  . Transportation needs:    Medical: Not on file    Non-medical: Not on file  Tobacco Use  . Smoking status: Current Every Day Smoker    Packs/day: 0.15    Years: 40.00    Pack years: 6.00    Types: Cigarettes  . Smokeless tobacco: Never Used  Substance and Sexual Activity  . Alcohol use: No  . Drug use: No  . Sexual activity: Yes  Lifestyle  . Physical activity:    Days per week: Not on file    Minutes per session:  Not on file  . Stress: Not on file  Relationships  . Social connections:    Talks on phone: Not on file    Gets together: Not on file    Attends religious service: Not on file    Active member of club or organization: Not on file    Attends meetings of clubs or organizations: Not on file    Relationship status: Not on file  . Intimate partner violence:    Fear of current or ex partner: Not on file    Emotionally abused: Not on file    Physically abused: Not on file    Forced sexual activity: Not on file  Other Topics Concern  . Not on file  Social History Narrative  . Not on file     Allergies  Allergen Reactions  . Penicillins Other (See Comments)    Syncope     Prior to Admission medications   Medication Sig  Start Date End Date Taking? Authorizing Provider  cyclobenzaprine (FLEXERIL) 10 MG tablet Take 1 tablet (10 mg total) by mouth at bedtime. 08/17/17   Georgina Quint, MD  hydrOXYzine (ATARAX/VISTARIL) 10 MG tablet Take 1 tablet (10 mg total) by mouth 3 (three) times daily as needed. 06/13/17   Ofilia Neas, PA-C     Depression screen [facility] 2/9 06/07/2018 08/17/2017 06/13/2017 03/11/2016 12/20/2014  Decreased Interest 0 0 0 0 0  Down, Depressed, Hopeless 0 0 0 0 0  PHQ - 2 Score 0 0 0 0 0     Fall Risk  06/07/2018 08/17/2017 06/13/2017 03/11/2016 12/18/2014  Falls in the past year? 0 Yes No No Yes  Number falls in past yr: 0 1 - - 1  Injury with Fall? 0 No - - Yes  Comment - - - - Knees swollen and skinned      PHYSICAL EXAM: BP 133/82   Ht 5\' 6"  (1.676 m)   Wt 145 lb (65.8 kg)   BMI 23.40 kg/m    Wt Readings from Last 3 Encounters:  06/07/18 145 lb (65.8 kg)  08/17/17 145 lb 3.2 oz (65.9 kg)  06/13/17 149 lb 12.8 oz (67.9 kg)     No exam data present    Physical Exam   Education/Counseling provided regarding diet and exercise, prevention of chronic diseases, smoking/tobacco cessation, if applicable, and reviewed "Covered Medicare Preventive Services."   ASSESSMENT/PLAN: 1. Screening for osteoporosis  2. Screening mammogram, encounter for

## 2018-07-06 NOTE — Progress Notes (Signed)
Presents today for [facility] [employer] Annual [facility] Visit  I connected with  [name].   The patient expressed understanding and agreed to proceed.    Date of last exam: 08/17/2017  Interpreter used for this visit? no  Patient Care Team: Sherren MochaShaw, Eva N, MD as PCP - General (Family Medicine)   Other items to address today:  Patient will schedule Mammogram  Discussed eye/dental yearly exams Discussed Shingrix,PNA 13 vaccine Discussed booking Colonoscopy  TDAP declined due to insurance  Patient was advised needs to establish care.    ADVANCE DIRECTIVES: Discussed:yes On File: no Materials Provided  yes  Immunization status:   There is no immunization history on file for this patient.   Health Maintenance Due  Topic Date Due  . COLONOSCOPY  08/28/1996  . DEXA SCAN  08/29/2011     Functional Status Survey: Is the patient deaf or have difficulty hearing?: No Does the patient have difficulty seeing, even when wearing glasses/contacts?: No Does the patient have difficulty concentrating, remembering, or making decisions?: No Does the patient have difficulty walking or climbing stairs?: No Does the patient have difficulty dressing or bathing?: No Does the patient have difficulty doing errands alone such as visiting a doctor's office or shopping?: No   6CIT Screen 06/07/2018  What Year? 0 points  What month? 0 points  What time? 0 points  Count back from 20 0 points  Months in reverse 0 points  Repeat phrase 0 points  Total Score 0        Office Visit from 06/07/2018 in [facility]  AUDIT-C Score  2       Home Environment:   One story home lives alone  No trouble climbing stairs Yes to [employer] No scatter rugs. Well lit.    Patient Active Problem List   Diagnosis Date Noted  . Acute back pain 08/17/2017  .  Vaginal itching 08/17/2017  . Muscle spasm 08/17/2017  . Musculoskeletal pain 08/17/2017  . Strain of lumbar region 08/17/2017  . Flank pain 03/11/2016     Past Medical History:  Diagnosis Date  . Arthritis      Past Surgical History:  Procedure Laterality Date  . ABDOMINAL HYSTERECTOMY    . APPENDECTOMY       No family history on file.   Social History   Socioeconomic History  . Marital status: Single    Spouse name: Not on file  . Number of children: Not on file  . Years of education: Not on file  . Highest education level: Not on file  Occupational History  . Not on file  Social Needs  . Financial resource strain: Not on file  . Food insecurity:    Worry: Not on file    Inability: Not on file  . Transportation needs:    Medical: Not on file    Non-medical: Not on file  Tobacco Use  . Smoking status: Current Every Day Smoker    Packs/day: 0.15    Years: 40.00    Pack years: 6.00    Types: Cigarettes  . Smokeless tobacco: Never Used  Substance and Sexual Activity  . Alcohol use: No  . Drug use: No  . Sexual activity: Yes  Lifestyle  . Physical activity:    Days per week: Not on file    Minutes per session: Not  on file  . Stress: Not on file  Relationships  . Social connections:    Talks on phone: Not on file    Gets together: Not on file    Attends religious service: Not on file    Active member of club or organization: Not on file    Attends meetings of clubs or organizations: Not on file    Relationship status: Not on file  . Intimate partner violence:    Fear of current or ex partner: Not on file    Emotionally abused: Not on file    Physically abused: Not on file    Forced sexual activity: Not on file  Other Topics Concern  . Not on file  Social History Narrative  . Not on file     Allergies  Allergen Reactions  . Penicillins Other (See Comments)    Syncope     Prior to Admission medications   Medication Sig Start Date End  Date Taking? Authorizing Provider  cyclobenzaprine (FLEXERIL) 10 MG tablet Take 1 tablet (10 mg total) by mouth at bedtime. 08/17/17   Georgina Quint, MD  hydrOXYzine (ATARAX/VISTARIL) 10 MG tablet Take 1 tablet (10 mg total) by mouth 3 (three) times daily as needed. 06/13/17   Ofilia Neas, PA-C     Depression screen [facility] 2/9 06/07/2018 08/17/2017 06/13/2017 03/11/2016 12/20/2014  Decreased Interest 0 0 0 0 0  Down, Depressed, Hopeless 0 0 0 0 0  PHQ - 2 Score 0 0 0 0 0     Fall Risk  06/07/2018 08/17/2017 06/13/2017 03/11/2016 12/18/2014  Falls in the past year? 0 Yes No No Yes  Number falls in past yr: 0 1 - - 1  Injury with Fall? 0 No - - Yes  Comment - - - - Knees swollen and skinned      PHYSICAL EXAM: BP 133/82   Ht 5\' 6"  (1.676 m)   Wt 145 lb (65.8 kg)   BMI 23.40 kg/m    Wt Readings from Last 3 Encounters:  06/07/18 145 lb (65.8 kg)  08/17/17 145 lb 3.2 oz (65.9 kg)  06/13/17 149 lb 12.8 oz (67.9 kg)     No exam data present    Physical Exam   Education/Counseling provided regarding diet and exercise, prevention of chronic diseases, smoking/tobacco cessation, if applicable, and reviewed "Covered Medicare Preventive Services."   ASSESSMENT/PLAN: 1. Screening for osteoporosis  2. Screening mammogram, encounter for

## 2019-07-19 DIAGNOSIS — B359 Dermatophytosis, unspecified: Secondary | ICD-10-CM | POA: Diagnosis not present

## 2019-07-19 DIAGNOSIS — R42 Dizziness and giddiness: Secondary | ICD-10-CM | POA: Diagnosis not present

## 2019-07-19 DIAGNOSIS — R439 Unspecified disturbances of smell and taste: Secondary | ICD-10-CM | POA: Diagnosis not present

## 2019-07-19 DIAGNOSIS — K30 Functional dyspepsia: Secondary | ICD-10-CM | POA: Diagnosis not present

## 2019-07-27 DIAGNOSIS — H6123 Impacted cerumen, bilateral: Secondary | ICD-10-CM | POA: Diagnosis not present

## 2019-07-27 DIAGNOSIS — R52 Pain, unspecified: Secondary | ICD-10-CM | POA: Diagnosis not present

## 2019-07-27 DIAGNOSIS — R7309 Other abnormal glucose: Secondary | ICD-10-CM | POA: Diagnosis not present

## 2019-07-27 DIAGNOSIS — R030 Elevated blood-pressure reading, without diagnosis of hypertension: Secondary | ICD-10-CM | POA: Diagnosis not present

## 2019-08-03 ENCOUNTER — Ambulatory Visit (INDEPENDENT_AMBULATORY_CARE_PROVIDER_SITE_OTHER): Payer: Medicare Other | Admitting: Podiatry

## 2019-08-03 ENCOUNTER — Other Ambulatory Visit: Payer: Self-pay

## 2019-08-03 DIAGNOSIS — L84 Corns and callosities: Secondary | ICD-10-CM

## 2019-08-07 ENCOUNTER — Encounter: Payer: Self-pay | Admitting: Podiatry

## 2019-08-07 NOTE — Progress Notes (Signed)
  Subjective:  Patient ID: [name], female    DOB: 10-05-1945,  MRN: [id number]  No chief complaint on file.   74 y.o. female presents with the above complaint.  Patient presents with a complaint of right fourth fifth digit heloma molle.  Patient states it has been for going on for 3 months has been sore tender.  Hurts with ambulating with shoes.  Patient not a diabetic.  There is no numbness and tingling associated with it.  Pain scale is 5 out of 10.  She has not been able to wear some type of shoe gear modifications.  She has not tried anything else for this.  She has not seen anyone else prior to see me.   Review of Systems: Negative except as noted in the HPI. Denies N/V/F/Ch.  Past Medical History:  Diagnosis Date  . Arthritis     Current Outpatient Medications:  .  cyclobenzaprine (FLEXERIL) 10 MG tablet, Take 1 tablet (10 mg total) by mouth at bedtime., Disp: 30 tablet, Rfl: 0 .  hydrOXYzine (ATARAX/VISTARIL) 10 MG tablet, Take 1 tablet (10 mg total) by mouth 3 (three) times daily as needed., Disp: 30 tablet, Rfl: 0  Social History   Tobacco Use  Smoking Status Current Every Day Smoker  . Packs/day: 0.15  . Years: 40.00  . Pack years: 6.00  . Types: Cigarettes  Smokeless Tobacco Never Used    Allergies  Allergen Reactions  . Penicillins Other (See Comments)    Syncope   Objective:  There were no vitals filed for this visit. There is no height or weight on file to calculate BMI. Constitutional Well developed. Well nourished.  Vascular Dorsalis pedis pulses palpable bilaterally. Posterior tibial pulses palpable bilaterally. Capillary refill normal to all digits.  No cyanosis or clubbing noted. Pedal hair growth normal.  Neurologic Normal speech. Oriented to person, place, and time. Epicritic sensation to light touch grossly present bilaterally.  Dermatologic Nails well groomed and normal in appearance. No open wounds. No skin lesions.  Orthopedic:   Right fifth digit heloma molle with hammertoe contractures of fourth and fifth digit.  No metatarsophalangeal joint pain of this fourth and fifth.  No pain with range of motion of the fourth and fifth digit.   Radiographs: None Assessment:   1. Heloma molle    Plan:  Patient was evaluated and treated and all questions answered.  Right fourth fifth digit heloma molle -I discussed with the patient the etiology of heloma molle and various treatment options were extensively discussed.  I believe patient will benefit from aggressive debridement of the lesion.  If there is no improvement we will consider steroid injection to decrease inflammatory component associated pain.  I also believe patient will benefit from offloading pad as well. -Offloading pad was dispensed -Using chisel blade and a handle of the lesion was debrided down to healthy striated tissue.  No complication noted.  No bleeding noted.  No follow-ups on file.

## 2019-08-13 ENCOUNTER — Other Ambulatory Visit: Payer: Self-pay | Admitting: Family Medicine

## 2019-08-13 DIAGNOSIS — R5381 Other malaise: Secondary | ICD-10-CM

## 2019-08-13 DIAGNOSIS — Z1231 Encounter for screening mammogram for malignant neoplasm of breast: Secondary | ICD-10-CM

## 2019-08-17 ENCOUNTER — Other Ambulatory Visit: Payer: Self-pay | Admitting: Family Medicine

## 2019-08-17 DIAGNOSIS — Z13820 Encounter for screening for osteoporosis: Secondary | ICD-10-CM

## 2019-10-25 ENCOUNTER — Ambulatory Visit
Admission: RE | Admit: 2019-10-25 | Discharge: 2019-10-25 | Disposition: A | Discharge status: Home / Self Care | Payer: Medicare Other | Source: Ambulatory Visit | Attending: Family Medicine | Admitting: Family Medicine

## 2019-10-25 ENCOUNTER — Other Ambulatory Visit: Payer: Self-pay

## 2019-10-25 DIAGNOSIS — Z13820 Encounter for screening for osteoporosis: Secondary | ICD-10-CM

## 2019-10-25 DIAGNOSIS — Z1231 Encounter for screening mammogram for malignant neoplasm of breast: Secondary | ICD-10-CM

## 2019-10-25 DIAGNOSIS — Z780 Asymptomatic menopausal state: Secondary | ICD-10-CM | POA: Diagnosis not present

## 2019-10-25 DIAGNOSIS — M85852 Other specified disorders of bone density and structure, left thigh: Secondary | ICD-10-CM | POA: Diagnosis not present

## 2019-12-07 DIAGNOSIS — Z0000 Encounter for general adult medical examination without abnormal findings: Secondary | ICD-10-CM | POA: Diagnosis not present

## 2019-12-07 DIAGNOSIS — Z79899 Other long term (current) drug therapy: Secondary | ICD-10-CM | POA: Diagnosis not present

## 2019-12-07 DIAGNOSIS — L731 Pseudofolliculitis barbae: Secondary | ICD-10-CM | POA: Diagnosis not present

## 2019-12-07 DIAGNOSIS — R030 Elevated blood-pressure reading, without diagnosis of hypertension: Secondary | ICD-10-CM | POA: Diagnosis not present

## 2019-12-07 DIAGNOSIS — R21 Rash and other nonspecific skin eruption: Secondary | ICD-10-CM | POA: Diagnosis not present

## 2019-12-07 DIAGNOSIS — E119 Type 2 diabetes mellitus without complications: Secondary | ICD-10-CM | POA: Diagnosis not present

## 2019-12-07 DIAGNOSIS — M8580 Other specified disorders of bone density and structure, unspecified site: Secondary | ICD-10-CM | POA: Diagnosis not present

## 2019-12-07 DIAGNOSIS — H6123 Impacted cerumen, bilateral: Secondary | ICD-10-CM | POA: Diagnosis not present

## 2019-12-07 DIAGNOSIS — R7309 Other abnormal glucose: Secondary | ICD-10-CM | POA: Diagnosis not present

## 2019-12-10 DIAGNOSIS — Z23 Encounter for immunization: Secondary | ICD-10-CM | POA: Diagnosis not present

## 2019-12-31 DIAGNOSIS — Z23 Encounter for immunization: Secondary | ICD-10-CM | POA: Diagnosis not present

## 2020-01-23 DIAGNOSIS — Z03818 Encounter for observation for suspected exposure to other biological agents ruled out: Secondary | ICD-10-CM | POA: Diagnosis not present

## 2022-08-30 ENCOUNTER — Ambulatory Visit: Admission: RE | Admit: 2022-08-30 | Payer: Medicare PPO | Source: Ambulatory Visit

## 2022-08-30 ENCOUNTER — Other Ambulatory Visit: Payer: Self-pay | Admitting: Student

## 2022-08-30 DIAGNOSIS — M549 Dorsalgia, unspecified: Secondary | ICD-10-CM
